# Patient Record
Sex: Male | Born: 1955 | Race: White | Hispanic: No | Smoking: Never smoker
Health system: Southern US, Community
[De-identification: ages and names within clinical notes are randomized; demographics above are authoritative.]

## PROBLEM LIST (undated history)

## (undated) DIAGNOSIS — I8390 Asymptomatic varicose veins of unspecified lower extremity: Secondary | ICD-10-CM

## (undated) DIAGNOSIS — K219 Gastro-esophageal reflux disease without esophagitis: Secondary | ICD-10-CM

## (undated) DIAGNOSIS — G629 Polyneuropathy, unspecified: Secondary | ICD-10-CM

## (undated) DIAGNOSIS — T7840XA Allergy, unspecified, initial encounter: Secondary | ICD-10-CM

## (undated) DIAGNOSIS — R0602 Shortness of breath: Secondary | ICD-10-CM

## (undated) HISTORY — DX: Polyneuropathy, unspecified: G62.9

## (undated) HISTORY — PX: VARICOSE VEIN SURGERY: SHX832

## (undated) HISTORY — PX: CATARACT EXTRACTION, BILATERAL: SHX1313

## (undated) HISTORY — PX: TONSILLECTOMY: SUR1361

## (undated) HISTORY — DX: Asymptomatic varicose veins of unspecified lower extremity: I83.90

## (undated) HISTORY — PX: HERNIA REPAIR: SHX51

## (undated) HISTORY — DX: Allergy, unspecified, initial encounter: T78.40XA

## (undated) HISTORY — DX: Shortness of breath: R06.02

---

## 2010-09-01 ENCOUNTER — Emergency Department (HOSPITAL_COMMUNITY)
Admission: EM | Admit: 2010-09-01 | Discharge: 2010-09-01 | Payer: Self-pay | Source: Home / Self Care | Admitting: Family Medicine

## 2011-01-21 ENCOUNTER — Ambulatory Visit (INDEPENDENT_AMBULATORY_CARE_PROVIDER_SITE_OTHER): Payer: Self-pay

## 2011-01-21 ENCOUNTER — Observation Stay (HOSPITAL_COMMUNITY)
Admission: EM | Admit: 2011-01-21 | Discharge: 2011-01-22 | Disposition: A | Discharge status: Home / Self Care | Payer: Self-pay | Attending: Emergency Medicine | Admitting: Emergency Medicine

## 2011-01-21 ENCOUNTER — Emergency Department (HOSPITAL_COMMUNITY): Payer: Self-pay

## 2011-01-21 ENCOUNTER — Inpatient Hospital Stay (INDEPENDENT_AMBULATORY_CARE_PROVIDER_SITE_OTHER)
Admission: RE | Admit: 2011-01-21 | Discharge: 2011-01-21 | Disposition: A | Discharge status: Home / Self Care | Payer: Self-pay | Source: Ambulatory Visit | Attending: Emergency Medicine | Admitting: Emergency Medicine

## 2011-01-21 DIAGNOSIS — R002 Palpitations: Secondary | ICD-10-CM

## 2011-01-21 DIAGNOSIS — R079 Chest pain, unspecified: Principal | ICD-10-CM | POA: Insufficient documentation

## 2011-01-21 DIAGNOSIS — R0602 Shortness of breath: Secondary | ICD-10-CM | POA: Insufficient documentation

## 2011-01-21 DIAGNOSIS — F1421 Cocaine dependence, in remission: Secondary | ICD-10-CM | POA: Insufficient documentation

## 2011-01-21 LAB — BASIC METABOLIC PANEL
BUN: 18 mg/dL (ref 6–23)
Chloride: 100 mEq/L (ref 96–112)
GFR calc Af Amer: >60 mL/min (ref >60)
Potassium: 3.7 mEq/L (ref 3.5–5.1)

## 2011-01-21 LAB — DIFFERENTIAL
Basophils Absolute: 0 10*3/uL (ref 0.0–0.1)
Eosinophils Relative: 2 % (ref 0–5)
Lymphocytes Relative: 19 % (ref 12–46)
Neutro Abs: 5.8 10*3/uL (ref 1.7–7.7)
Neutrophils Relative %: 72 % (ref 43–77)

## 2011-01-21 LAB — POCT URINALYSIS DIP (DEVICE)
Glucose, UA: NEGATIVE mg/dL
Leukocytes, UA: NEGATIVE
Nitrite: NEGATIVE
Urobilinogen, UA: 0.2 mg/dL (ref 0.0–1.0)

## 2011-01-21 LAB — CBC
HCT: 41.9 % (ref 39.0–52.0)
RDW: 13 % (ref 11.5–15.5)
WBC: 8.1 10*3/uL (ref 4.0–10.5)

## 2011-01-21 LAB — TROPONIN I: Troponin I: <0.3 ng/mL (ref <0.30)

## 2011-01-21 LAB — POCT I-STAT, CHEM 8
Glucose, Bld: 103 mg/dL — ABNORMAL HIGH (ref 70–99)
HCT: 47 % (ref 39.0–52.0)
Hemoglobin: 16 g/dL (ref 13.0–17.0)
Potassium: 3.8 mEq/L (ref 3.5–5.1)
Sodium: 141 mEq/L (ref 135–145)

## 2011-01-21 LAB — CK TOTAL AND CKMB (NOT AT ARMC)
Relative Index: 2.3 (ref 0.0–2.5)
Total CK: 205 U/L (ref 7–232)

## 2011-01-22 ENCOUNTER — Observation Stay (HOSPITAL_COMMUNITY): Payer: Self-pay

## 2011-01-22 ENCOUNTER — Encounter (HOSPITAL_COMMUNITY): Payer: Self-pay

## 2011-01-22 LAB — CK TOTAL AND CKMB (NOT AT ARMC)
CK, MB: 4.9 ng/mL — ABNORMAL HIGH (ref 0.3–4.0)
Relative Index: 2.6 — ABNORMAL HIGH (ref 0.0–2.5)
Total CK: 190 U/L (ref 7–232)
Total CK: 194 U/L (ref 7–232)

## 2011-01-22 LAB — TROPONIN I: Troponin I: <0.3 ng/mL (ref <0.30)

## 2011-01-22 MED ORDER — IOHEXOL 350 MG/ML SOLN
80.0000 mL | Freq: Once | INTRAVENOUS | Status: AC | PRN
  Administered 2011-01-22: 80 mL via INTRAVENOUS

## 2011-02-16 ENCOUNTER — Encounter: Payer: Self-pay | Admitting: *Deleted

## 2011-02-16 ENCOUNTER — Encounter: Payer: Self-pay | Admitting: Cardiovascular Disease

## 2011-02-17 ENCOUNTER — Encounter: Payer: Self-pay | Admitting: Cardiovascular Disease

## 2011-02-17 ENCOUNTER — Ambulatory Visit (INDEPENDENT_AMBULATORY_CARE_PROVIDER_SITE_OTHER): Payer: Self-pay | Admitting: Cardiovascular Disease

## 2011-02-17 DIAGNOSIS — R079 Chest pain, unspecified: Secondary | ICD-10-CM

## 2011-02-17 NOTE — Patient Instructions (Signed)
Your physician has requested that you have an exercise tolerance test. For further information please visit [email or web address]. Please also follow instruction sheet, as given.  Your physician has requested that you have an echocardiogram. Echocardiography is a painless test that uses sound waves to create images of your heart. It provides your doctor with information about the size and shape of your heart and how well your heart's chambers and valves are working. This procedure takes approximately one hour. There are no restrictions for this procedure.   

## 2011-02-17 NOTE — Assessment & Plan Note (Signed)
Exertional chest pressure, palpitations and SOB. His only risk factor for CAD is prior cocaine abuse. Will arrange treadmill stress test and echo.

## 2011-02-17 NOTE — Progress Notes (Signed)
History of Present Illness: 55 yo WM with history of venous insufficiency who is here today for new patient evaluation of palpitations. Former crack cocaine abuse but has not used in last 7 months. He was seen in the [facility] ED January 22, 2011 for chest pressure, palpitations. He describes tachycardia, diaphoresis, chest pressure when exerting himself. In the ED he had sinus rhythm. He is working in the [employer] in rehab and is feeling poorly. He has never smoked.   Past Medical History  Diagnosis Date  . SOB (shortness of breath)   . Chest pain     Past Surgical History  Procedure Date  . Hernia repair     No current outpatient prescriptions on file.    No Known Allergies  History   Social History  . Marital Status: Divorced    Spouse Name: N/A    Number of Children: N/A  . Years of Education: N/A   Occupational History  . Not on file.   Social History Main Topics  . Smoking status: Former Games developer  . Smokeless tobacco: Not on file  . Alcohol Use: Not on file  . Drug Use: Not on file  . Sexually Active: Not on file   Other Topics Concern  . Not on file   Social History Narrative  . No narrative on file    No family history on file.  Review of Systems:  As stated in the HPI and otherwise negative.   BP 122/85  Pulse 70  Resp 18  Ht 6\' 2"  (1.88 m)  Wt 240 lb (108.863 kg)  BMI 30.81 kg/m2  Physical Examination: General: Well developed, well nourished, NAD HEENT: OP clear, mucus membranes moist SKIN: warm, dry. No rashes. Neuro: No focal deficits Musculoskeletal: Muscle strength 5/5 all ext Psychiatric: Mood and affect normal Neck: No JVD, no carotid bruits, no thyromegaly, no lymphadenopathy. Lungs:Clear bilaterally, no wheezes, rhonci, crackles Cardiovascular: Regular rate and rhythm. No murmurs, gallops or rubs. Abdomen:Soft. Bowel sounds present. Non-tender.  Extremities: No lower extremity edema. Pulses are 2 + in the bilateral DP/PT.  EKG:NSR,  rate 67 bpm. Normal EKG.

## 2011-04-01 ENCOUNTER — Ambulatory Visit (HOSPITAL_COMMUNITY): Payer: Self-pay | Attending: Cardiovascular Disease | Admitting: Radiology

## 2011-04-01 ENCOUNTER — Ambulatory Visit (INDEPENDENT_AMBULATORY_CARE_PROVIDER_SITE_OTHER): Payer: Self-pay | Admitting: Cardiovascular Disease

## 2011-04-01 DIAGNOSIS — R002 Palpitations: Secondary | ICD-10-CM | POA: Insufficient documentation

## 2011-04-01 DIAGNOSIS — R072 Precordial pain: Secondary | ICD-10-CM | POA: Insufficient documentation

## 2011-04-01 DIAGNOSIS — R079 Chest pain, unspecified: Secondary | ICD-10-CM

## 2011-04-01 DIAGNOSIS — R0609 Other forms of dyspnea: Secondary | ICD-10-CM | POA: Insufficient documentation

## 2011-04-01 DIAGNOSIS — E669 Obesity, unspecified: Secondary | ICD-10-CM | POA: Insufficient documentation

## 2011-04-01 DIAGNOSIS — R0989 Other specified symptoms and signs involving the circulatory and respiratory systems: Secondary | ICD-10-CM | POA: Insufficient documentation

## 2011-04-01 NOTE — Progress Notes (Signed)
Exercise Treadmill Test  Pre-Exercise Testing Evaluation Rhythm: normal sinus  Rate: 67   PR:  .19 QRS:  .09  QT:  41 QTc: .43     Test  Exercise Tolerance Test Ordering MD: Melene Muller, MD  Interpreting MD:  Melene Muller, MD  Unique Test No: 1  Treadmill:  1  Indication for ETT: chest pain - rule out ischemia  Contraindication to ETT: No   Stress Modality: exercise - treadmill  Cardiac Imaging Performed: non   Protocol: standard Toure - maximal  Max BP:  170/88  Max MPHR (bpm):  166 85% MPR (bpm):  141  MPHR obtained (bpm): 171 % MPHR obtained:  103  Reached 85% MPHR (min:sec):  6:00 Total Exercise Time (min-sec):  9.0  Workload in METS:  16 Borg Scale: 10.1  Reason ETT Terminated:  fatigue    ST Segment Analysis At Rest: normal ST segments - no evidence of significant ST depression With Exercise: no evidence of significant ST depression  Other Information Arrhythmia:  No Angina during ETT:  absent (0) Quality of ETT:  non-diagnostic  ETT Interpretation:  normal - no evidence of ischemia by ST analysis  Comments: Excellent exercise tolerance. No chest pain with exercise. No EKG changes to suggest ischemia.   Recommendations: No further cardiac workup at this time.

## 2011-07-08 ENCOUNTER — Emergency Department (INDEPENDENT_AMBULATORY_CARE_PROVIDER_SITE_OTHER)
Admission: EM | Admit: 2011-07-08 | Discharge: 2011-07-08 | Disposition: A | Discharge status: Home / Self Care | Payer: Self-pay | Source: Home / Self Care | Attending: Family Medicine | Admitting: Family Medicine

## 2011-07-08 ENCOUNTER — Encounter (HOSPITAL_COMMUNITY): Payer: Self-pay | Admitting: Emergency Medicine

## 2011-07-08 DIAGNOSIS — M549 Dorsalgia, unspecified: Secondary | ICD-10-CM

## 2011-07-08 MED ORDER — NAPROXEN 500 MG PO TABS: 500.0000 mg | ORAL_TABLET | Freq: Two times a day (BID) | ORAL | Status: AC

## 2011-07-08 MED ORDER — CYCLOBENZAPRINE HCL 5 MG PO TABS: 5.0000 mg | ORAL_TABLET | Freq: Three times a day (TID) | ORAL | Status: AC | PRN
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
## 2012-01-31 ENCOUNTER — Encounter (HOSPITAL_COMMUNITY): Payer: Self-pay | Admitting: *Deleted

## 2012-01-31 ENCOUNTER — Emergency Department (INDEPENDENT_AMBULATORY_CARE_PROVIDER_SITE_OTHER)
Admission: EM | Admit: 2012-01-31 | Discharge: 2012-01-31 | Disposition: A | Discharge status: Home / Self Care | Payer: Self-pay | Source: Home / Self Care

## 2012-01-31 DIAGNOSIS — M25561 Pain in right knee: Secondary | ICD-10-CM

## 2012-01-31 DIAGNOSIS — M25569 Pain in unspecified knee: Secondary | ICD-10-CM

## 2012-01-31 MED ORDER — NAPROXEN 500 MG PO TABS: 500.0000 mg | ORAL_TABLET | Freq: Two times a day (BID) | ORAL | Status: DC

## 2012-01-31 MED ORDER — FAMOTIDINE 20 MG PO TABS: 20.0000 mg | ORAL_TABLET | Freq: Two times a day (BID) | ORAL | Status: DC

## 2012-01-31 NOTE — ED Provider Notes (Signed)
History     CSN: [id number]  Arrival date & time 01/31/12  1504   None     Chief Complaint  Patient presents with  . Knee Pain    (Consider location/radiation/quality/duration/timing/severity/associated sxs/prior treatment) Patient is a 56 y.o. male presenting with knee pain.  Knee Pain  pt reports right knee pain for three months that is getting progressively worse, states no known injury but has been doing increased daily walking, reports associated stiffness in the throughout the day, previous injury while playing sports in high school.  Pain is constant, able to ambulate but with noted limp.  Has not taken medication for pain, recently finished drug rehab program at [facility]  Past Medical History  Diagnosis Date  . SOB (shortness of breath)   . Chest pain     Past Surgical History  Procedure Date  . Hernia repair     History reviewed. No pertinent family history.  History  Substance Use Topics  . Smoking status: Former Games developer  . Smokeless tobacco: Not on file  . Alcohol Use: No      Review of Systems  All other systems reviewed and are negative.    Allergies  Review of patient's allergies indicates no known allergies.  Home Medications   Current Outpatient Rx  Name Route Sig Dispense Refill  . FAMOTIDINE 20 MG PO TABS Oral Take 1 tablet (20 mg total) by mouth 2 (two) times daily. 30 tablet 2  . NAPROXEN 500 MG PO TABS Oral Take 1 tablet (500 mg total) by mouth 2 (two) times daily. 30 tablet 0  . NAPROXEN 500 MG PO TABS Oral Take 1 tablet (500 mg total) by mouth 2 (two) times daily. 30 tablet 2    BP 122/87  Pulse 78  Temp 97.9 F (36.6 C) (Oral)  Resp 16  SpO2 95%  Physical Exam  Nursing note and vitals reviewed. Constitutional: He is oriented to person, place, and time. Vital signs are normal. He appears well-developed and well-nourished. He is active and cooperative.  HENT:  Head: Normocephalic.  Eyes: Conjunctivae are normal. Pupils  are equal, round, and reactive to light. No scleral icterus.  Neck: Trachea normal. Neck supple.  Cardiovascular: Normal rate, regular rhythm and normal heart sounds.   Pulmonary/Chest: Effort normal and breath sounds normal.  Musculoskeletal:       Right knee: He exhibits no swelling, no effusion, no erythema, normal alignment and no LCL laxity. no tenderness found. No medial joint line, no lateral joint line, no MCL, no LCL and no patellar tendon tenderness noted.       Left knee: Normal.  Neurological: He is alert and oriented to person, place, and time. He has normal reflexes. No cranial nerve deficit or sensory deficit.  Skin: Skin is warm and dry.  Psychiatric: He has a normal mood and affect. His speech is normal and behavior is normal. Judgment and thought content normal. Cognition and memory are normal.    ED Course  Procedures (including critical care time)  Labs Reviewed - No data to display No results found.   1. Right knee pain       MDM  No imaging indicated, begin medication as ordered, famotidine for GERD.  Johnsie Kindred, NP 01/31/12 1710

## 2012-01-31 NOTE — Discharge Instructions (Signed)
Knee Pain The knee is the complex joint between your thigh and your lower leg. It is made up of bones, tendons, ligaments, and cartilage. The bones that make up the knee are:  The femur in the thigh.   The tibia and fibula in the lower leg.   The patella or kneecap riding in the groove on the lower femur.  CAUSES  Knee pain is a common complaint with many causes. A few of these causes are:  Injury, such as:   A ruptured ligament or tendon injury.   Torn cartilage.   Medical conditions, such as:   Gout   Arthritis   Infections   Overuse, over training or overdoing a physical activity.  Knee pain can be minor or severe. Knee pain can accompany debilitating injury. Minor knee problems often respond well to self-care measures or get well on their own. More serious injuries may need medical intervention or even surgery. SYMPTOMS The knee is complex. Symptoms of knee problems can vary widely. Some of the problems are:  Pain with movement and weight bearing.   Swelling and tenderness.   Buckling of the knee.   Inability to straighten or extend your knee.   Your knee locks and you cannot straighten it.   Warmth and redness with pain and fever.   Deformity or dislocation of the kneecap.  DIAGNOSIS  Determining what is wrong may be very straight forward such as when there is an injury. It can also be challenging because of the complexity of the knee. Tests to make a diagnosis may include:  Your caregiver taking a history and doing a physical exam.   Routine X-rays can be used to rule out other problems. X-rays will not reveal a cartilage tear. Some injuries of the knee can be diagnosed by:   Arthroscopy a surgical technique by which a small video camera is inserted through tiny incisions on the sides of the knee. This procedure is used to examine and repair internal knee joint problems. Tiny instruments can be used during arthroscopy to repair the torn knee cartilage  (meniscus).   Arthrography is a radiology technique. A contrast liquid is directly injected into the knee joint. Internal structures of the knee joint then become visible on X-ray film.   An MRI scan is a non x-ray radiology procedure in which magnetic fields and a computer produce two- or three-dimensional images of the inside of the knee. Cartilage tears are often visible using an MRI scanner. MRI scans have largely replaced arthrography in diagnosing cartilage tears of the knee.   Blood work.   Examination of the fluid that helps to lubricate the knee joint (synovial fluid). This is done by taking a sample out using a needle and a syringe.  TREATMENT The treatment of knee problems depends on the cause. Some of these treatments are:  Depending on the injury, proper casting, splinting, surgery or physical therapy care will be needed.   Give yourself adequate recovery time. Do not overuse your joints. If you begin to get sore during workout routines, back off. Slow down or do fewer repetitions.   For repetitive activities such as cycling or running, maintain your strength and nutrition.   Alternate muscle groups. For example if you are a weight lifter, work the upper body on one day and the lower body the next.   Either tight or weak muscles do not give the proper support for your knee. Tight or weak muscles do not absorb the stress placed   on the knee joint. Keep the muscles surrounding the knee strong.   Take care of mechanical problems.   If you have flat feet, orthotics or special shoes may help. See your caregiver if you need help.   Arch supports, sometimes with wedges on the inner or outer aspect of the heel, can help. These can shift pressure away from the side of the knee most bothered by osteoarthritis.   A brace called an "unloader" brace also may be used to help ease the pressure on the most arthritic side of the knee.   If your caregiver has prescribed crutches, braces,  wraps or ice, use as directed. The acronym for this is PRICE. This means protection, rest, ice, compression and elevation.   Nonsteroidal anti-inflammatory drugs (NSAID's), can help relieve pain. But if taken immediately after an injury, they may actually increase swelling. Take NSAID's with food in your stomach. Stop them if you develop stomach problems. Do not take these if you have a history of ulcers, stomach pain or bleeding from the bowel. Do not take without your caregiver's approval if you have problems with fluid retention, heart failure, or kidney problems.   For ongoing knee problems, physical therapy may be helpful.   Glucosamine and chondroitin are over-the-counter dietary supplements. Both may help relieve the pain of osteoarthritis in the knee. These medicines are different from the usual anti-inflammatory drugs. Glucosamine may decrease the rate of cartilage destruction.   Injections of a corticosteroid drug into your knee joint may help reduce the symptoms of an arthritis flare-up. They may provide pain relief that lasts a few months. You may have to wait a few months between injections. The injections do have a small increased risk of infection, water retention and elevated blood sugar levels.   Hyaluronic acid injected into damaged joints may ease pain and provide lubrication. These injections may work by reducing inflammation. A series of shots may give relief for as long as 6 months.   Topical painkillers. Applying certain ointments to your skin may help relieve the pain and stiffness of osteoarthritis. Ask your pharmacist for suggestions. Many over the-counter products are approved for temporary relief of arthritis pain.   In some countries, doctors often prescribe topical NSAID's for relief of chronic conditions such as arthritis and tendinitis. A review of treatment with NSAID creams found that they worked as well as oral medications but without the serious side effects.    PREVENTION  Maintain a healthy weight. Extra pounds put more strain on your joints.   Get strong, stay limber. Weak muscles are a common cause of knee injuries. Stretching is important. Include flexibility exercises in your workouts.   Be smart about exercise. If you have osteoarthritis, chronic knee pain or recurring injuries, you may need to change the way you exercise. This does not mean you have to stop being active. If your knees ache after jogging or playing basketball, consider switching to swimming, water aerobics or other low-impact activities, at least for a few days a week. Sometimes limiting high-impact activities will provide relief.   Make sure your shoes fit well. Choose footwear that is right for your sport.   Protect your knees. Use the proper gear for knee-sensitive activities. Use kneepads when playing volleyball or laying carpet. Buckle your seat belt every time you drive. Most shattered kneecaps occur in car accidents.   Rest when you are tired.  SEEK MEDICAL CARE IF:  You have knee pain that is continual and does not   seem to be getting better.  SEEK IMMEDIATE MEDICAL CARE IF:  Your knee joint feels hot to the touch and you have a high fever. MAKE SURE YOU:   Understand these instructions.   Will watch your condition.   Will get help right away if you are not doing well or get worse.  Document Released: 05/17/2007 Document Revised: 07/09/2011 Document Reviewed: 05/17/2007 [employer] Patient Information 2012 [employer]. 

## 2012-01-31 NOTE — ED Provider Notes (Signed)
Medical screening examination/treatment/procedure(s) were performed by non-physician practitioner and as supervising physician I was immediately available for consultation/collaboration.  Leslee Home, M.D.   Reuben Likes, MD 01/31/12 [phone]

## 2012-01-31 NOTE — ED Notes (Signed)
Pt with c/o pain right knee onset 3 months swelling - worse with movement -

## 2012-12-01 ENCOUNTER — Encounter (HOSPITAL_COMMUNITY): Payer: Self-pay

## 2012-12-01 ENCOUNTER — Emergency Department (INDEPENDENT_AMBULATORY_CARE_PROVIDER_SITE_OTHER)
Admission: EM | Admit: 2012-12-01 | Discharge: 2012-12-01 | Disposition: A | Discharge status: Home Health | Payer: No Typology Code available for payment source | Source: Home / Self Care

## 2012-12-01 DIAGNOSIS — R079 Chest pain, unspecified: Secondary | ICD-10-CM

## 2012-12-01 MED ORDER — OMEPRAZOLE 10 MG PO CPDR: 40.0000 mg | DELAYED_RELEASE_CAPSULE | Freq: Every day | ORAL | Status: DC

## 2012-12-01 NOTE — ED Notes (Signed)
Patient complains of having heartburn and acid reflux Get a sour taste most of time

## 2012-12-01 NOTE — ED Provider Notes (Signed)
Patient Demographics  Justin Mcbride, is a 57 y.o. male  WUJ:[id number]  NFA:[id number]  DOB - [birth year]  Chief Complaint  Patient presents with  . Heartburn        Subjective:   Justin Mcbride today is here to establish primary care.For the past few months he has been having "heartburn"-that has started to worsen lately. He takes tums that provides him relief temporarily. He denies any weight loss or difficulty swallowing. He denies any chest pain-either at rest or exertion. He last did cocaine approximately 1 year back, and he was a alcoholic as well-claims he has drank in "years". He claims that he has nocturia but no hesitation, and good stream.   Patient has No headache, No chest pain, No abdominal pain - No Nausea, No new weakness tingling or numbness, No Cough - SOB.  Objective:    Filed Vitals:   12/01/12 1552  BP: 132/97  Pulse: 72  Temp: 98.1 F (36.7 C)  TempSrc: Oral  Resp: 16  SpO2: 100%     ALLERGIES:  No Known Allergies  PAST MEDICAL HISTORY: Past Medical History  Diagnosis Date  . SOB (shortness of breath)   . Chest pain     PAST SURGICAL HISTORY: Past Surgical History  Procedure Laterality Date  . Hernia repair      FAMILY HISTORY: Father-"Bone Cancer"  MEDICATIONS AT HOME: Prior to Admission medications   Medication Sig Start Date End Date Taking? Authorizing Provider  omeprazole (PRILOSEC) 10 MG capsule Take 4 capsules (40 mg total) by mouth daily. 12/01/12   Shanker Levora Dredge, MD    REVIEW OF SYSTEMS:  Constitutional:   No   Fevers, chills, fatigue.  HEENT:    No headaches, Sore throat,   Cardio-vascular: No chest pain,  Orthopnea, swelling in lower extremities, anasarca, palpitations  GI:  No abdominal pain, nausea, vomiting, diarrhea  Resp: No shortness of breath,  No coughing up of blood.No cough.No wheezing.  Skin:  no rash or lesions.  GU:  no dysuria, change in color of urine.  No flank pain.  Musculoskeletal: No joint  pain or swelling.  No decreased range of motion.  No back pain.  Psych: No change in mood or affect. No depression or anxiety.  No memory loss.   Exam  General appearance :Awake, alert, not in any distress. Speech Clear. Not toxic Looking HEENT: Atraumatic and Normocephalic, pupils equally reactive to light and accomodation Neck: supple, no JVD. No cervical lymphadenopathy.  Chest:Good air entry bilaterally, no added sounds  CVS: S1 S2 regular, no murmurs.  Abdomen: Bowel sounds present, Non tender and not distended with no gaurding, rigidity or rebound. Extremities: B/L Lower Ext shows no edema, both legs are warm to touch Neurology: Awake alert, and oriented X 3, CN II-XII intact, Non focal Skin:No Rash Wounds:N/A    Data Review   CBC No results found for this basename: WBC, HGB, HCT, PLT, MCV, MCH, MCHC, RDW, NEUTRABS, LYMPHSABS, MONOABS, EOSABS, BASOSABS, BANDABS, BANDSABD,  in the last 168 hours  Chemistries   No results found for this basename: NA, K, CL, CO2, GLUCOSE, BUN, CREATININE, GFRCGP, CALCIUM, MG, AST, ALT, ALKPHOS, BILITOT,  in the last 168 hours ------------------------------------------------------------------------------------------------------------------ No results found for this basename: HGBA1C,  in the last 72 hours ------------------------------------------------------------------------------------------------------------------ No results found for this basename: CHOL, HDL, LDLCALC, TRIG, CHOLHDL, LDLDIRECT,  in the last 72 hours ------------------------------------------------------------------------------------------------------------------ No results found for this basename: TSH, T4TOTAL, FREET3, T3FREE, THYROIDAB,  in the last 72 hours ------------------------------------------------------------------------------------------------------------------  No results found for this basename: VITAMINB12, FOLATE, FERRITIN, TIBC, IRON, RETICCTPCT,  in the last 72  hours  Coagulation profile  No results found for this basename: INR, PROTIME,  in the last 168 hours    Assessment & Plan   Active Problems: Suspected GERD -trial of PPI and lifestyle/diet modifications-gave hand out -follow up in one month-if no improvement-needs GI referral  Suspected BPH -prefers to wait before starting Flomax-will check routine labs prior to next visit  Former ETOH/Cocaine user -check UDS periodically-claims he is currently clean  Follow up in 1 month-have ordered labs-please check at next visit.   Follow-up Information   Follow up with [facility]. Schedule an appointment as soon as possible for a visit in 1 month.      Maretta Bees, MD 12/01/12 [phone]

## 2012-12-13 ENCOUNTER — Telehealth: Payer: Self-pay | Admitting: General Practice

## 2012-12-13 NOTE — Telephone Encounter (Signed)
Pharm trying to confirm omeprazole script written on 12/01/12 by Dr. Jerral Ralph.

## 2012-12-19 ENCOUNTER — Telehealth: Payer: Self-pay | Admitting: *Deleted

## 2012-12-19 NOTE — Telephone Encounter (Signed)
Spoke with patient regarding Prilosec has already been filled At [facility].

## 2013-02-07 ENCOUNTER — Encounter: Payer: Self-pay | Admitting: Internal Medicine

## 2013-02-07 ENCOUNTER — Ambulatory Visit: Payer: No Typology Code available for payment source | Attending: Family Medicine | Admitting: Internal Medicine

## 2013-02-07 VITALS — BP 135/90 | HR 72 | Temp 98.7°F | Resp 18 | Ht 74.0 in | Wt 256.0 lb

## 2013-02-07 DIAGNOSIS — G589 Mononeuropathy, unspecified: Secondary | ICD-10-CM

## 2013-02-07 DIAGNOSIS — G629 Polyneuropathy, unspecified: Secondary | ICD-10-CM | POA: Insufficient documentation

## 2013-02-07 DIAGNOSIS — Z79899 Other long term (current) drug therapy: Secondary | ICD-10-CM | POA: Insufficient documentation

## 2013-02-07 DIAGNOSIS — G5790 Unspecified mononeuropathy of unspecified lower limb: Secondary | ICD-10-CM | POA: Insufficient documentation

## 2013-02-07 DIAGNOSIS — I8390 Asymptomatic varicose veins of unspecified lower extremity: Secondary | ICD-10-CM | POA: Insufficient documentation

## 2013-02-07 LAB — LIPID PANEL
Cholesterol: 215 mg/dL — ABNORMAL HIGH (ref 0–200)
HDL: 29 mg/dL — ABNORMAL LOW
LDL Cholesterol: 149 mg/dL — ABNORMAL HIGH (ref 0–99)
Total CHOL/HDL Ratio: 7.4 ratio
Triglycerides: 186 mg/dL — ABNORMAL HIGH
VLDL: 37 mg/dL (ref 0–40)

## 2013-02-07 LAB — POCT GLYCOSYLATED HEMOGLOBIN (HGB A1C): Hemoglobin A1C: 5.5

## 2013-02-07 MED ORDER — TRAMADOL HCL 50 MG PO TABS: 50.0000 mg | ORAL_TABLET | Freq: Three times a day (TID) | ORAL | Status: DC | PRN

## 2013-02-07 MED ORDER — PREGABALIN 50 MG PO CAPS: 50.0000 mg | ORAL_CAPSULE | Freq: Three times a day (TID) | ORAL | Status: DC

## 2013-02-07 NOTE — Progress Notes (Signed)
Patient ID: Justin Mcbride, male   DOB: 1955/12/01, 57 y.o.   MRN: [id number]  CC: Neuropathy  HPI: 57 year old male with no significant past medical history who presented to our clinic for evaluation of lower extremity neuropathy and varicose veins. Patient reports this to be a chronic problem. He reports pain is worse when standing on one spot and when he tries to move he feels tingling sensation and numbness in his feet. Patient reports that over the course of the day this numbness and tingling is getting worse. No complaints of chest pain or palpitations or shortness of breath. No loss of sensation. No weakness.  No Known Allergies Past Medical History  Diagnosis Date  . SOB (shortness of breath)   . Chest pain    Current Outpatient Prescriptions on File Prior to Visit  Medication Sig Dispense Refill  . omeprazole (PRILOSEC) 10 MG capsule Take 4 capsules (40 mg total) by mouth daily.  30 capsule  1  . [DISCONTINUED] famotidine (PEPCID) 20 MG tablet Take 1 tablet (20 mg total) by mouth 2 (two) times daily.  30 tablet  2   No current facility-administered medications on file prior to visit.   Family history significant for heart disease.  History   Social History  . Marital Status: Divorced    Spouse Name: N/A    Number of Children: N/A  . Years of Education: N/A   Occupational History  . Not on file.   Social History Main Topics  . Smoking status: Former Games developer  . Smokeless tobacco: Not on file  . Alcohol Use: No  . Drug Use: Yes    Special: Cocaine     Comment: none x 7 months   . 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 Not on file   Social History Narrative  . No narrative on file    Review of Systems  Constitutional: Negative for fever, chills, diaphoresis, activity change, appetite change and fatigue.  HENT: Negative for ear pain, nosebleeds, congestion, facial swelling, rhinorrhea, neck pain,  neck stiffness and ear discharge.   Eyes: Negative for pain, discharge, redness, itching and visual disturbance.  Respiratory: Negative for cough, choking, chest tightness, shortness of breath, wheezing and stridor.   Cardiovascular: Negative for chest pain, palpitations and leg swelling.  Gastrointestinal: Negative for abdominal distention.  Genitourinary: Negative for dysuria, urgency, frequency, hematuria, flank pain, decreased urine volume, difficulty urinating and dyspareunia.  Musculoskeletal: Negative for back pain, joint swelling, arthralgias and gait problem.  Neurological: Negative for dizziness, tremors, seizures, syncope, facial asymmetry, speech difficulty, weakness, light-headedness. Positive for numbness and tingling in feet  Hematological: Negative for adenopathy. Does not bruise/bleed easily.  Psychiatric/Behavioral: Negative for hallucinations, behavioral problems, confusion, dysphoric mood, decreased concentration and agitation.    Objective:   Filed Vitals:   02/07/13 1619  BP: 135/90  Pulse: 72  Temp: 98.7 F (37.1 C)  Resp: 18    Physical Exam  Constitutional: Appears well-developed and well-nourished. No distress.  HENT: Normocephalic. External right and left ear normal. Oropharynx is clear and moist.  Eyes: Conjunctivae and EOM are normal. PERRLA, no scleral icterus.  Neck: Normal ROM. Neck supple. No JVD. No tracheal deviation. No thyromegaly.  CVS: RRR, S1/S2 +, no murmurs, no gallops, no carotid bruit.  Pulmonary: Effort and breath sounds normal, no stridor, rhonchi, wheezes, rales.  Abdominal: Soft. BS +,  no distension, tenderness, rebound or  guarding.  Musculoskeletal: Normal range of motion. No edema and no tenderness.  Lymphadenopathy: No lymphadenopathy noted, cervical, inguinal. Neuro: Alert. Normal reflexes, muscle tone coordination. No cranial nerve deficit. Skin: Skin is warm and dry. Varicose veins bilaterally  Psychiatric: Normal mood and  affect. Behavior, judgment, thought content normal.   Lab Results  Component Value Date   WBC 8.1 01/21/2011   HGB 14.1 01/21/2011   HCT 41.9 01/21/2011   MCV 86.4 01/21/2011   PLT 192 01/21/2011   Lab Results  Component Value Date   CREATININE 0.78 01/21/2011   BUN 18 01/21/2011   NA 138 01/21/2011   K 3.7 01/21/2011   CL 100 01/21/2011   CO2 27 01/21/2011    No results found for this basename: HGBA1C   Lipid Panel  No results found for this basename: chol, trig, hdl, cholhdl, vldl, ldlcalc       Assessment and plan:   Patient Active Problem List   Diagnosis Date Noted  . Neuropathy 02/07/2013    Priority: High - Prescription provided for Lyrica 50 mg 3 times a day as well as Ultram as needed for breakthrough pain relief - Referral to vascular surgery provide for further evaluation of arterial blood flow  - check A1c and lipid panel

## 2013-02-07 NOTE — Progress Notes (Signed)
Pt here for office visit concerning pain in both legs with numbness in l ft toes since last visit. Pain is increasing. Taking tylenol for comfort

## 2013-02-07 NOTE — Patient Instructions (Addendum)
Neuropathy Neuropathy means your peripheral nerves are not working normally. Peripheral nerves are the nerves outside the brain and spinal cord. Messages between the brain and the rest of the body do not work properly with peripheral nerve disorders. CAUSES There are many different causes of peripheral nerve disorders. These include:  Injury.   Infections.   Diabetes.   Vitamin deficiency.   Poor circulation.   Alcoholism.   Exposure to toxins.   Drug effects.   Tumors.   Kidney disease.  SYMPTOMS  Tingling, burning, pain, and numbness in the extremities.   Weakness and loss of muscle tone and size.  DIAGNOSIS Blood tests and special studies of nerve function may help confirm the diagnosis.  TREATMENT  Treatment includes adopting healthy life habits.   A good diet, vitamin supplements, and mild pain medicine may be needed.   Avoid known toxins such as alcohol, tobacco, and recreational drugs.   Anti-convulsant medicines are helpful in some types of neuropathy.  Make a follow-up appointment with your caregiver to be sure you are getting better with treatment.  SEEK IMMEDIATE MEDICAL CARE IF:   You have breathing problems.   You have severe or uncontrolled pain.   You notice extreme weakness or you feel faint.   You are not better after 1 week or if you have worse symptoms.  Document Released: 08/27/2004 Document Revised: 04/01/2011 Document Reviewed: 07/20/2005 [employer] Patient Information 2012 [employer]. 

## 2013-02-10 ENCOUNTER — Encounter: Payer: Self-pay | Admitting: Vascular Surgery

## 2013-02-10 ENCOUNTER — Other Ambulatory Visit: Payer: Self-pay

## 2013-02-10 DIAGNOSIS — I83893 Varicose veins of bilateral lower extremities with other complications: Secondary | ICD-10-CM

## 2013-02-10 DIAGNOSIS — M79609 Pain in unspecified limb: Secondary | ICD-10-CM

## 2013-03-24 ENCOUNTER — Encounter: Payer: No Typology Code available for payment source | Admitting: Vascular Surgery

## 2013-04-13 ENCOUNTER — Encounter: Payer: Self-pay | Admitting: Vascular Surgery

## 2013-04-14 ENCOUNTER — Encounter: Payer: Self-pay | Admitting: Vascular Surgery

## 2013-04-14 ENCOUNTER — Ambulatory Visit (INDEPENDENT_AMBULATORY_CARE_PROVIDER_SITE_OTHER): Payer: No Typology Code available for payment source | Admitting: Vascular Surgery

## 2013-04-14 ENCOUNTER — Encounter (INDEPENDENT_AMBULATORY_CARE_PROVIDER_SITE_OTHER): Payer: No Typology Code available for payment source | Admitting: *Deleted

## 2013-04-14 VITALS — BP 138/93 | HR 66 | Resp 18 | Ht 73.0 in | Wt 257.0 lb

## 2013-04-14 DIAGNOSIS — M79609 Pain in unspecified limb: Secondary | ICD-10-CM

## 2013-04-14 DIAGNOSIS — I872 Venous insufficiency (chronic) (peripheral): Secondary | ICD-10-CM | POA: Insufficient documentation

## 2013-04-14 DIAGNOSIS — I83893 Varicose veins of bilateral lower extremities with other complications: Secondary | ICD-10-CM

## 2013-04-14 NOTE — Progress Notes (Signed)
[facility]  Referred by:  Alison Murray, MD [address] [place], [place] [address]  Reason for referral: Swollen bilateral leg  History of Present Illness  Justin Mcbride is a 57 y.o. (02-17-1956) male who presents with chief complaint: B swollen leg.  Patient notes, onset of swelling decades ago, associated with no obvious trigger.  The patient's symptoms include: aching, swelling, and bursting sensation.  The patient has had no history of DVT, known history of varicose vein, prior history of left ankle venous stasis ulcers, no history of  Lymphedema and known history of skin changes in lower legs.  There is no family history of venous disorders.  The patient has used thigh compression stockings in the past but not recently.  The pt come in due to recent healing of his L ankle VSU.  Pt notes some toe numbness also  Past Medical History  Diagnosis Date  . SOB (shortness of breath)   . Chest pain   . Varicose veins   . Neuropathy   . Allergy     Past Surgical History  Procedure Laterality Date  . Hernia repair    . Hernia repair    . Tonsillectomy      History   Social History  . Marital Status: Divorced    Spouse Name: N/A    Number of Children: N/A  . Years of Education: N/A   Occupational History  . Not on file.   Social History Main Topics  . Smoking status: Never Smoker   . Smokeless tobacco: Never Used  . Alcohol Use: No  . Drug Use: Yes    Special: Cocaine     Comment: none x 7 months   . 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 Not on file   Social History Narrative  . No narrative on file    Family History  Problem Relation Age of Onset  . Cancer Father     lung     Current Outpatient Prescriptions on File Prior to Visit  Medication Sig Dispense Refill  . omeprazole (PRILOSEC) 10 MG capsule Take 4 capsules (40 mg total) by mouth daily.  30  capsule  1  . pregabalin (LYRICA) 50 MG capsule Take 1 capsule (50 mg total) by mouth 3 (three) times daily.  90 capsule  0  . traMADol (ULTRAM) 50 MG tablet Take 1 tablet (50 mg total) by mouth every 8 (eight) hours as needed for pain.  45 tablet  0  . [DISCONTINUED] famotidine (PEPCID) 20 MG tablet Take 1 tablet (20 mg total) by mouth 2 (two) times daily.  30 tablet  2   No current facility-administered medications on file prior to visit.    No Known Allergies  REVIEW OF SYSTEMS:  (Positives checked otherwise negative)  CARDIOVASCULAR:  []  chest pain, []  chest pressure, []  palpitations, []  shortness of breath when laying flat, []  shortness of breath with exertion,  [x]  pain in feet when walking, [x]  pain in feet when laying flat, []  history of blood clot in veins (DVT), []  history of phlebitis, [x]  swelling in legs, [x]  varicose veins  PULMONARY:  []  productive cough, []  asthma, []  wheezing  NEUROLOGIC:  []  weakness in arms or legs, []  numbness in arms or legs, []  difficulty speaking or slurred speech, []  temporary loss of vision in one eye, []  dizziness  HEMATOLOGIC:  []  bleeding problems, []  problems with blood clotting too easily  MUSCULOSKEL:  []  joint pain, []  joint swelling  GASTROINTEST:  []  vomiting blood, []  blood in stool     GENITOURINARY:  []  burning with urination, []  blood in urine  PSYCHIATRIC:  []  history of major depression  INTEGUMENTARY:  []  rashes, []  ulcers  CONSTITUTIONAL:  []  fever, []  chills  Physical Examination Filed Vitals:   04/14/13 1307  BP: 138/93  Pulse: 66  Resp: 18  Height: 6\' 1"  (1.854 m)  Weight: 257 lb (116.574 kg)   Body mass index is 33.91 kg/(m^2).  General: A&O x 3, WD, mildly obese  Head: [place]/AT  Ear/Nose/Throat: Hearing grossly intact, nares w/o erythema or drainage, oropharynx w/o Erythema/Exudate  Eyes: PERRLA, EOMI  Neck: Supple, no nuchal rigidity, no palpable LAD  Pulmonary: Sym exp, good air movt, CTAB, no rales,  rhonchi, & wheezing  Cardiac: RRR, Nl S1, S2, no Murmurs, rubs or gallops  Vascular: Vessel Right Left  Radial Palpable Palpable  Brachial Palpable Palpable  Carotid Palpable, without bruit Palpable, without bruit  Aorta Not palpable N/A  Femoral Palpable Palpable  Popliteal Not palpable Not palpable  PT Palpable Palpable  DP Palpable Palpable   Gastrointestinal: soft, NTND, -G/R, - HSM, - masses, - CVAT B  Musculoskeletal: M/S 5/5 throughout , Extremities without ischemic changes , B LDS, extensive large varicosities bilaterally, L ankle healed ulcer site  Neurologic: CN 2-12 intact , Pain and light touch intact in extremities , Motor exam as listed above  Psychiatric: Judgment intact, Mood & affect appropriate for pt's clinical situation  Dermatologic: See M/S exam for extremity exam, no rashes otherwise noted  Lymph : No Cervical, Axillary, or Inguinal lymphadenopathy   Non-Invasive Vascular Imaging   BLE Venous Insufficiency Duplex (Date: 04/14/2013):   RLE: no DVT and SVT, + GSV reflux, + deep venous reflux, + SSV reflux  LLE: no DVT and SVT, + GSV reflux, + deep venous reflux  ABI (Date: 04/14/2013)  R: 1.29 (), DP: tri, PT: tri, TBI: 0.85  L: 1.32 (), DP: tri, PT: tri, TBI: 0.88   Outside Studies/Documentation 3 pages of outside documents were reviewed including: outpatient clinic chart.  Medical Decision Making  Justin Mcbride is a 57 y.o. male who presents with: BLE chronic venous insufficiency (C5), medial calcification of BLE arteries, no evidence of PAD at this point   Based on the patient's history and examination, I recommend: compressive therapy for 3 months followed by EVLA +/- stab phlebectomy BLE.  I discussed with the patient the use of her 20-30 mm thigh high compression stockings and need for 3 month trial of such.  The patient will follow up in 3 months with my partners in the [facility] for evaluation for: EVLA +/- stab phlebectomy.  Thank  you for allowing [place] to participate in this patient's care.  Leonides Sake, MD [facility] Office: [phone] Pager: [phone]  04/14/2013, 1:59 PM

## 2013-05-23 ENCOUNTER — Ambulatory Visit (HOSPITAL_COMMUNITY)
Admission: RE | Admit: 2013-05-23 | Discharge: 2013-05-23 | Disposition: A | Discharge status: Home / Self Care | Payer: No Typology Code available for payment source | Source: Ambulatory Visit | Attending: Internal Medicine | Admitting: Internal Medicine

## 2013-05-23 ENCOUNTER — Ambulatory Visit: Payer: No Typology Code available for payment source | Attending: Internal Medicine | Admitting: Internal Medicine

## 2013-05-23 VITALS — BP 130/88 | HR 78 | Temp 98.1°F | Resp 16 | Ht 73.0 in | Wt 252.0 lb

## 2013-05-23 DIAGNOSIS — J209 Acute bronchitis, unspecified: Secondary | ICD-10-CM | POA: Insufficient documentation

## 2013-05-23 DIAGNOSIS — R059 Cough, unspecified: Secondary | ICD-10-CM | POA: Insufficient documentation

## 2013-05-23 DIAGNOSIS — I872 Venous insufficiency (chronic) (peripheral): Secondary | ICD-10-CM | POA: Insufficient documentation

## 2013-05-23 DIAGNOSIS — R05 Cough: Secondary | ICD-10-CM | POA: Insufficient documentation

## 2013-05-23 DIAGNOSIS — K449 Diaphragmatic hernia without obstruction or gangrene: Secondary | ICD-10-CM | POA: Insufficient documentation

## 2013-05-23 MED ORDER — CETIRIZINE HCL 10 MG PO TABS: 10.0000 mg | ORAL_TABLET | Freq: Every day | ORAL | Status: DC

## 2013-05-23 MED ORDER — AZITHROMYCIN 250 MG PO TABS: ORAL_TABLET | ORAL | Status: DC

## 2013-05-23 NOTE — Progress Notes (Signed)
Pt here for URI/SINUS sx's x 4 dys Cough with noted blood tinged sputum today,sob Joint pain all over Not taking medication

## 2013-05-23 NOTE — Progress Notes (Signed)
Patient ID: Justin Mcbride, male   DOB: 1956/03/16, 57 y.o.   MRN: [id number]  History of present illness 57 year old male with chronic venous insufficiency here with symptoms of cough, nasal congestion and an episode of hemoptysis this morning. She reports having cough and nasal congestion for almost one week. Denies any headache, fever or chills. He reports having several bouts of cough this morning followed by about 1 teaspoon full bright red blood. He reports having cough with whitish phlegm. Denies any sinus tenderness or ear aches. Denies chest pain, palpitations,. Does report feeling winded. Denies orthopnea or PND. Chronic venous insufficiency and also feels tingling in his legs at bedtime. He was prescribed Lyrica but he did not take his prescriptions. He was recently seen by a vascular surgeon and was recommended for compressive therapy for 3 months and followup in the clinic.  Vital signs in last 24 hours:  Filed Vitals:   05/23/13 1133  BP: 130/88  Pulse: 78  Temp: 98.1 F (36.7 C)  TempSrc: Oral  Resp: 16  Height: 6\' 1"  (1.854 m)  Weight: 252 lb (114.306 kg)  SpO2: 98%      Physical Exam:  General: middle Aged  male in no acute distress. HEENT: no pallor, no icterus, moist oral mucosa, no JVD, no lymphadenopathy Heart: Normal  s1 &s2  Regular rate and rhythm, without murmurs, rubs, gallops. Lungs: Clear to auscultation bilaterally. Extremities: Chronic venous insufficiency bilaterally Neuro: Alert, awake, oriented x3, nonfocal.   Lab Results:  Basic Metabolic Panel:    Component Value Date/Time   NA 138 01/21/2011 2131   K 3.7 01/21/2011 2131   CL 100 01/21/2011 2131   CO2 27 01/21/2011 2131   BUN 18 01/21/2011 2131   CREATININE 0.78 01/21/2011 2131   GLUCOSE 95 01/21/2011 2131   CALCIUM 8.9 01/21/2011 2131   CBC:    Component Value Date/Time   WBC 8.1 01/21/2011 2131   HGB 14.1 01/21/2011 2131   HCT 41.9 01/21/2011 2131   PLT 192 01/21/2011 2131   MCV 86.4  01/21/2011 2131   NEUTROABS 5.8 01/21/2011 2131   LYMPHSABS 1.5 01/21/2011 2131   MONOABS 0.6 01/21/2011 2131   EOSABS 0.1 01/21/2011 2131   BASOSABS 0.0 01/21/2011 2131    No results found for this or any previous visit (from the past 240 hour(s)).  Studies/Results: No results found.  Medications: Scheduled Meds: Continuous Infusions: PRN Meds:.    Assessment/Plan:  Acute bronchitis I will order  a chest x-ray to rule out any infiltrates. I will prescribe him with a course of Z-Pak and Zyrtec at bedtime. Instructed on drinking plenty of water and steam inhalation to help improve his nasal congestion. Patient had one episode of hemoptysis this morning likely in the setting of persistent coughing and online bronchitis. I will followup with a chest x-ray. Patient  Return back to the clinic or go to the ED if he has worsening of his symptoms with associated fever or further  Hemoptysis.  Chronic venous insufficiency Follows with vascular surgery. Patient was prescribed Lyrica for RLS -like symptoms and has not filled them.  Followup as scheduled previously  Khing Belcher 05/23/2013, 12:01 PM

## 2013-06-08 ENCOUNTER — Other Ambulatory Visit: Payer: Self-pay

## 2013-07-14 ENCOUNTER — Encounter: Payer: Self-pay | Admitting: Vascular Surgery

## 2013-07-17 ENCOUNTER — Encounter: Payer: Self-pay | Admitting: Vascular Surgery

## 2013-07-17 ENCOUNTER — Ambulatory Visit (INDEPENDENT_AMBULATORY_CARE_PROVIDER_SITE_OTHER): Payer: No Typology Code available for payment source | Admitting: Vascular Surgery

## 2013-07-17 VITALS — BP 142/93 | HR 71 | Resp 18 | Ht 73.0 in | Wt 253.0 lb

## 2013-07-17 DIAGNOSIS — I83893 Varicose veins of bilateral lower extremities with other complications: Secondary | ICD-10-CM

## 2013-07-17 NOTE — Progress Notes (Signed)
Subjective:     Patient ID: Justin Mcbride, male   DOB: [birth year], 57 y.o.   MRN: [id number]  HPI this 57 year old male returns for continued followup regarding his severe venous insufficiency of both lower extremities. He was evaluated by Dr. Johny Drilling 3 months ago and has been wearing long leg elastic compression stockings 20-30 mm gradient as well as trying elevation and ibuprofen on a daily basis. He continues to have severe aching throbbing and burning discomfort in both legs as the day progresses. He also has a history of venous stasis ulcer in the left ankle which is currently healed. He has had these varicose veins over the past 20-30 years and they have continuously enlarged and become more symptomatic.  Past Medical History  Diagnosis Date  . SOB (shortness of breath)   . Chest pain   . Varicose veins   . Neuropathy   . Allergy     History  Substance Use Topics  . Smoking status: Never Smoker   . Smokeless tobacco: Never Used  . Alcohol Use: No    Family History  Problem Relation Age of Onset  . Cancer Father     lung    No Known Allergies  Current outpatient prescriptions:omeprazole (PRILOSEC) 10 MG capsule, Take 4 capsules (40 mg total) by mouth daily., Disp: 30 capsule, Rfl: 1;  azithromycin (ZITHROMAX) 250 MG tablet, Take 2 tablets today followed by 1 tablet daily for next 4 days, Disp: 6 tablet, Rfl: 0;  cetirizine (ZYRTEC) 10 MG tablet, Take 1 tablet (10 mg total) by mouth at bedtime., Disp: 15 tablet, Rfl: 0 pregabalin (LYRICA) 50 MG capsule, Take 1 capsule (50 mg total) by mouth 3 (three) times daily., Disp: 90 capsule, Rfl: 0;  traMADol (ULTRAM) 50 MG tablet, Take 1 tablet (50 mg total) by mouth every 8 (eight) hours as needed for pain., Disp: 45 tablet, Rfl: 0;  [DISCONTINUED] famotidine (PEPCID) 20 MG tablet, Take 1 tablet (20 mg total) by mouth 2 (two) times daily., Disp: 30 tablet, Rfl: 2  BP 142/93  Pulse 71  Resp 18  Ht 6\' 1"  (1.854 m)  Wt 253 lb (114.76 kg)  BMI  33.39 kg/m2  Body mass index is 33.39 kg/(m^2).          Review of Systems denies chest pain, dyspnea on exertion, PND, orthopnea, hemoptysis    Objective:   Physical Exam BP 142/93  Pulse 71  Resp 18  Ht 6\' 1"  (1.854 m)  Wt 253 lb (114.76 kg)  BMI 33.39 kg/m2  General well-developed well-nourished male no apparent stress alert and oriented x3 Lungs no rhonchi or wheezing Bilateral lower extremities with severe extensive large bulging varicosities beginning in the distal thigh extending into the medial calf as well as posterior calf bilaterally. Evidence of healed ulcer left medial malleolar area. 3 posterior cells pedis pulse palpable bilaterally. 1+ chronic edema bilaterally with hyperpigmentation lower third of both legs no active ulcers.     Assessment:     Severe reflux bilateral great saphenous veins with painful bulging varicosities and history of healed stasis ulcer left ankle-symptoms not responding to conservative measures Patient needs #1 laser ablation left great saphenous vein with greater than 20 stab phlebectomy followed by #2 laser ablation right small saphenous vein followed by #3 laser ablation right great saphenous vein with greater than 20 stab phlebectomy Will proceed with precertification to perform this in the near future    Plan:     Plan to proceed to perform  laser ablation procedures as outlined above to  stabilize severe skin changes, prevent further ulceration, and relieve symptoms

## 2013-07-18 ENCOUNTER — Other Ambulatory Visit: Payer: Self-pay | Admitting: *Deleted

## 2013-07-18 DIAGNOSIS — I83893 Varicose veins of bilateral lower extremities with other complications: Secondary | ICD-10-CM

## 2013-08-18 ENCOUNTER — Encounter: Payer: Self-pay | Admitting: Vascular Surgery

## 2013-08-21 ENCOUNTER — Encounter: Payer: Self-pay | Admitting: Vascular Surgery

## 2013-08-21 ENCOUNTER — Ambulatory Visit (INDEPENDENT_AMBULATORY_CARE_PROVIDER_SITE_OTHER): Payer: Self-pay | Admitting: Vascular Surgery

## 2013-08-21 VITALS — BP 111/80 | HR 96 | Resp 18 | Ht 73.0 in | Wt 248.0 lb

## 2013-08-21 DIAGNOSIS — I83893 Varicose veins of bilateral lower extremities with other complications: Secondary | ICD-10-CM

## 2013-08-21 NOTE — Progress Notes (Signed)
   Laser Ablation Procedure      Date: 08/21/2013    Justin RiversBruce Poynor DOB:October 16, 1955  Consent signed: Yes  Surgeon:J.D. Hart RochesterLawson  Procedure: Laser Ablation: left Greater Saphenous Vein  BP 111/80  Pulse 96  Resp 18  Ht 6\' 1"  (1.854 m)  Wt 248 lb (112.492 kg)  BMI 32.73 kg/m2  Start time: 1:05p   End time: 2:40  Tumescent Anesthesia: 485 cc 0.9% NaCl with 50 cc Lidocaine HCL with 1% Epi and 15 cc 8.4% NaHCO3  Local Anesthesia: 11 cc Lidocaine HCL and NaHCO3 (ratio 2:1)  Pulsed mode: 17 watts, 500ms delay, 1.0 duration Total energy: 3036, total pulses: 179, total time: 2:59     Stab Phlebectomy: >20 Sites: Thigh and Calf  Patient tolerated procedure well: Yes  Notes:   Description of Procedure:  After marking the course of the saphenous vein and the secondary varicosities in the standing position, the patient was placed on the operating table in the supine position, and the left leg was prepped and draped in sterile fashion. Local anesthetic was administered, and under ultrasound guidance the saphenous vein was accessed with a micro needle and guide wire; then the micro puncture sheath was placed. A guide wire was inserted to the saphenofemoral junction, followed by a 5 french sheath.  The position of the sheath and then the laser fiber below the junction was confirmed using the ultrasound and visualization of the aiming beam.  Tumescent anesthesia was administered along the course of the saphenous vein using ultrasound guidance. Protective laser glasses were placed on the patient, and the laser was fired at 17 watts pulsed mode.  For a total of 3036 joules.  A steri strip was applied to the puncture site.  The patient was then put into Trendelenburg position.  Local anesthetic was utilized overlying the marked varicosities.  Greater than 20 stab wounds were made using the tip of an 11 blade; and using the vein hook,  The phlebectomies were performed using a hemostat to avulse these  varicosities.  Adequate hemostasis was achieved, and steri strips were applied to the stab wound.      ABD pads and thigh high compression stockings were applied.  Ace wrap bandages were applied over the phlebectomy sites and at the top of the saphenofemoral junction.  Blood loss was less than 15 cc.  The patient ambulated out of the operating room having tolerated the procedure well.

## 2013-08-21 NOTE — Progress Notes (Signed)
Subjective:     Patient ID: Justin RiversBruce Mcbride, male   DOB: Dec 05, 1955, 58 y.o.   MRN: [id number]  HPI this 58 year old male had laser ablation left great saphenous vein from the proximal calf to near the saphenofemoral junction plus approximately 30 stab phlebectomy of painful varicosities performed under local tumescent anesthesia. A total of 3036 J of energy was utilized. He tolerated the procedure well to   Review of Systems     Objective:   Physical Exam BP 111/80  Pulse 96  Resp 18  Ht 6\' 1"  (1.854 m)  Wt 248 lb (112.492 kg)  BMI 32.73 kg/m2       Assessment:     Well-tolerated laser ablation left great saphenous vein with greater than 20 stab phlebectomy painful varicosities performed under local tumescent anesthesia    Plan:     Return in one week for venous duplex exam to confirm closure left great saphenous vein We'll then proceed with laser ablation right small saphenous vein to be followed by laser ablation right great saphenous vein with multiple stab phlebectomy

## 2013-08-22 ENCOUNTER — Telehealth: Payer: Self-pay | Admitting: *Deleted

## 2013-08-22 ENCOUNTER — Encounter: Payer: Self-pay | Admitting: Vascular Surgery

## 2013-08-22 NOTE — Telephone Encounter (Signed)
Patient doing well. No bleeding from stab sites. Following all instructions.

## 2013-08-25 ENCOUNTER — Encounter: Payer: Self-pay | Admitting: Vascular Surgery

## 2013-08-28 ENCOUNTER — Encounter: Payer: Self-pay | Admitting: Vascular Surgery

## 2013-08-28 ENCOUNTER — Ambulatory Visit (HOSPITAL_COMMUNITY)
Admission: RE | Admit: 2013-08-28 | Discharge: 2013-08-28 | Disposition: A | Discharge status: Home / Self Care | Payer: No Typology Code available for payment source | Source: Ambulatory Visit | Attending: Vascular Surgery | Admitting: Vascular Surgery

## 2013-08-28 ENCOUNTER — Ambulatory Visit (INDEPENDENT_AMBULATORY_CARE_PROVIDER_SITE_OTHER): Payer: No Typology Code available for payment source | Admitting: Vascular Surgery

## 2013-08-28 VITALS — BP 125/92 | HR 102 | Resp 16 | Ht 73.0 in | Wt 246.0 lb

## 2013-08-28 DIAGNOSIS — I83893 Varicose veins of bilateral lower extremities with other complications: Secondary | ICD-10-CM

## 2013-08-28 DIAGNOSIS — Z09 Encounter for follow-up examination after completed treatment for conditions other than malignant neoplasm: Secondary | ICD-10-CM | POA: Insufficient documentation

## 2013-08-28 NOTE — Progress Notes (Signed)
Subjective:     Patient ID: Justin Mcbride, male   DOB: 1955/09/08, 58 y.o.   MRN: [id number]  HPI this 58 year old male is one week post laser ablation left great saphenous vein and proximally 30 stab phlebectomy of painful varicosities. He has had some mild/moderate discomfort along the course of the great saphenous vein. He has had no distal edema. Stab phlebectomy sites have not caused pain.    Review of Systems     Objective:   Physical Exam BP 125/92  Pulse 102  Resp 16  Ht 6\' 1"  (1.854 m)  Wt 246 lb (111.585 kg)  BMI 32.46 kg/m2  General well-developed well-nourished male no apparent stress alert and oriented x3 Lungs rhonchi or wheezing Left leg with moderate ecchymosis and discomfort along course of great saphenous vein from saphenofemoral junction to knee. Stab phlebotomy sites healing well. There is thrombosis of the great saphenous vein up to the saphenofemoral junction but there is not occlusion of the deep system.      Assessment:     Successful laser ablation left great saphenous vein with multiple stab phlebectomy for severe bulging varicosities    Plan:     Return next week for similar procedure on contralateral right leg

## 2013-09-01 ENCOUNTER — Encounter: Payer: Self-pay | Admitting: Vascular Surgery

## 2013-09-04 ENCOUNTER — Encounter: Payer: Self-pay | Admitting: Vascular Surgery

## 2013-09-04 ENCOUNTER — Ambulatory Visit (INDEPENDENT_AMBULATORY_CARE_PROVIDER_SITE_OTHER): Payer: No Typology Code available for payment source | Admitting: Vascular Surgery

## 2013-09-04 VITALS — BP 126/85 | HR 92 | Resp 16 | Ht 73.0 in | Wt 244.0 lb

## 2013-09-04 DIAGNOSIS — I83893 Varicose veins of bilateral lower extremities with other complications: Secondary | ICD-10-CM

## 2013-09-04 NOTE — Progress Notes (Signed)
   Laser Ablation Procedure      Date: 09/04/2013    Douglass RiversBruce Lesesne DOB:[birth year]  Consent signed: Yes  Surgeon:J.D. Hart RochesterLawson  Procedure: Laser Ablation: right Greater Saphenous Vein  BP 126/85  Pulse 92  Resp 16  Ht 6\' 1"  (1.854 m)  Wt 244 lb (110.678 kg)  BMI 32.20 kg/m2  Start time: 9:25   End time: 10:35  Tumescent Anesthesia: 450 cc 0.9% NaCl with 50 cc Lidocaine HCL with 1% Epi and 15 cc 8.4% NaHCO3  Local Anesthesia: 7 cc Lidocaine HCL and NaHCO3 (ratio 2:1)  Pulsed mode: 17 watts, 500 ms delay. 1.0 duration Total energy: 2003, total pulses: 119, total time: 1:58    Stab Phlebectomy: >20 Sites: Thigh and Calf  Patient tolerated procedure well: Yes  Notes:   Description of Procedure:  After marking the course of the saphenous vein and the secondary varicosities in the standing position, the patient was placed on the operating table in the supine position, and the right leg was prepped and draped in sterile fashion. Local anesthetic was administered, and under ultrasound guidance the saphenous vein was accessed with a micro needle and guide wire; then the micro puncture sheath was placed. A guide wire was inserted to the saphenofemoral junction, followed by a 5 french sheath.  The position of the sheath and then the laser fiber below the junction was confirmed using the ultrasound and visualization of the aiming beam.  Tumescent anesthesia was administered along the course of the saphenous vein using ultrasound guidance. Protective laser glasses were placed on the patient, and the laser was fired at 17 watts.  For a total of 2003 joules.  A steri strip was applied to the puncture site.  The patient was then put into Trendelenburg position.  Local anesthetic was utilized overlying the marked varicosities.  Greater than 20 stab wounds were made using the tip of an 11 blade; and using the vein hook,  The phlebectomies were performed using a hemostat to avulse these varicosities.   Adequate hemostasis was achieved, and steri strips were applied to the stab wound.      ABD pads and thigh high compression stockings were applied.  Ace wrap bandages were applied over the phlebectomy sites and at the top of the saphenofemoral junction.  Blood loss was less than 15 cc.  The patient ambulated out of the operating room having tolerated the procedure well.

## 2013-09-04 NOTE — Progress Notes (Signed)
Subjective:     Patient ID: Douglass RiversBruce Spearing, male   DOB: 1955-09-18, 58 y.o.   MRN: [id number]  HPI this 58 year old male had laser ablation right great saphenous vein with greater than 20 stab phlebectomy performed under local tumescent anesthesia for painful varicosities. He tolerated the procedure well. 2006 J of energy was utilized  Review of Systems     Objective:   Physical Exam BP 126/85  Pulse 92  Resp 16  Ht 6\' 1"  (1.854 m)  Wt 244 lb (110.678 kg)  BMI 32.20 kg/m2       Assessment:     Well-tolerated laser ablation right great saphenous vein plus greater than 20 stab phlebectomy of painful varicosities performed under local tumescent anesthesia    Plan:     Return in one week for venous duplex exam to confirm closure right great saphenous vein and this will complete treatment regimen

## 2013-09-05 ENCOUNTER — Encounter: Payer: Self-pay | Admitting: Vascular Surgery

## 2013-09-05 ENCOUNTER — Telehealth: Payer: Self-pay | Admitting: *Deleted

## 2013-09-05 NOTE — Telephone Encounter (Signed)
Asked him to call if having any problems or concerns

## 2013-09-08 ENCOUNTER — Telehealth: Payer: Self-pay | Admitting: *Deleted

## 2013-09-08 ENCOUNTER — Encounter: Payer: Self-pay | Admitting: Vascular Surgery

## 2013-09-08 NOTE — Telephone Encounter (Signed)
Left detailed voice phone message in response to pt.'s request wanting to delay venous duplex and VV Fu with Dr. Hart RochesterLawson on 09-11-2013 due to painful, sore right leg.  Explained importance of venous duplex to assess that GSV is closed and there are no problems/complications. Emphasized importance that he keep his appointment on 09-11-2013 for venous duplex and to see Dr. Hart RochesterLawson.  Recommended that he wear his compression hose as instructed, use ice compress over painful area right leg, elevate right leg when sitting, and increase Ibuprofen dosage to 800 mg (four 200mg  tablets) three times daily with meals.  Requested that he call me if he had further questions or concerns or if he was experiencing right ankle/foot pain or swelling.

## 2013-09-11 ENCOUNTER — Encounter: Payer: Self-pay | Admitting: Vascular Surgery

## 2013-09-11 ENCOUNTER — Ambulatory Visit (INDEPENDENT_AMBULATORY_CARE_PROVIDER_SITE_OTHER): Payer: Self-pay | Admitting: Vascular Surgery

## 2013-09-11 ENCOUNTER — Ambulatory Visit (HOSPITAL_COMMUNITY)
Admission: RE | Admit: 2013-09-11 | Discharge: 2013-09-11 | Disposition: A | Discharge status: Home / Self Care | Payer: No Typology Code available for payment source | Source: Ambulatory Visit | Attending: Vascular Surgery | Admitting: Vascular Surgery

## 2013-09-11 VITALS — BP 127/87 | HR 89 | Resp 16 | Ht 73.0 in | Wt 240.0 lb

## 2013-09-11 DIAGNOSIS — I83893 Varicose veins of bilateral lower extremities with other complications: Secondary | ICD-10-CM

## 2013-09-11 DIAGNOSIS — Z09 Encounter for follow-up examination after completed treatment for conditions other than malignant neoplasm: Secondary | ICD-10-CM | POA: Insufficient documentation

## 2013-09-11 NOTE — Progress Notes (Signed)
Subjective:     Patient ID: Douglass RiversBruce Sifuentes, male   DOB: 1956/03/29, 58 y.o.   MRN: [id number]  HPI this 58 year old male returns 1 week post laser ablation right great saphenous vein with greater than 30 stab phlebectomy of severe painful varicosities. He has had some mild/moderate discomfort in the thigh and calf area which is improving. He has taken ibuprofen as instructed and 1 his long-leg elastic compression stockings. The contralateral left leg continues to improve.  Review of Systems     Objective:   Physical Exam BP 127/87  Pulse 89  Resp 16  Ht 6\' 1"  (1.854 m)  Wt 240 lb (108.863 kg)  BMI 31.67 kg/m2  General well-developed well-nourished male no apparent stress alert and oriented x3 Lower extremities with tenderness along the course of great saphenous veins right greater than left. Stab tubectomy sites all healing satisfactorily with the posterior cells pedis pulse palpable bilaterally.  Today ordered a venous duplex exam which I reviewed and interpreted. Great saphenous vein the right leg is totally closed from distal thigh to near the saphenofemoral junction there is no DVT.      Assessment:     Successful bilateral laser ablation right and left great saphenous veins with multiple stab phlebectomy of large painful varicosities    Plan:     Will return to see us on when necessary basis

## 2013-09-13 ENCOUNTER — Other Ambulatory Visit: Payer: Self-pay | Admitting: Vascular Surgery

## 2013-09-13 DIAGNOSIS — I83893 Varicose veins of bilateral lower extremities with other complications: Secondary | ICD-10-CM

## 2014-02-07 ENCOUNTER — Emergency Department (HOSPITAL_COMMUNITY): Payer: No Typology Code available for payment source

## 2014-02-07 ENCOUNTER — Emergency Department (HOSPITAL_COMMUNITY): Payer: Self-pay

## 2014-02-07 ENCOUNTER — Emergency Department (HOSPITAL_COMMUNITY)
Admission: EM | Admit: 2014-02-07 | Discharge: 2014-02-07 | Disposition: A | Discharge status: Home / Self Care | Payer: No Typology Code available for payment source | Attending: Emergency Medicine | Admitting: Emergency Medicine

## 2014-02-07 ENCOUNTER — Encounter (HOSPITAL_COMMUNITY): Payer: Self-pay | Admitting: Emergency Medicine

## 2014-02-07 DIAGNOSIS — M25559 Pain in unspecified hip: Secondary | ICD-10-CM | POA: Insufficient documentation

## 2014-02-07 DIAGNOSIS — Z8679 Personal history of other diseases of the circulatory system: Secondary | ICD-10-CM | POA: Insufficient documentation

## 2014-02-07 DIAGNOSIS — R112 Nausea with vomiting, unspecified: Secondary | ICD-10-CM | POA: Insufficient documentation

## 2014-02-07 DIAGNOSIS — R42 Dizziness and giddiness: Secondary | ICD-10-CM | POA: Insufficient documentation

## 2014-02-07 DIAGNOSIS — Z79899 Other long term (current) drug therapy: Secondary | ICD-10-CM | POA: Insufficient documentation

## 2014-02-07 DIAGNOSIS — Z8669 Personal history of other diseases of the nervous system and sense organs: Secondary | ICD-10-CM | POA: Insufficient documentation

## 2014-02-07 LAB — CBC
HCT: 45.4 % (ref 39.0–52.0)
Hemoglobin: 15.3 g/dL (ref 13.0–17.0)
MCH: 29.1 pg (ref 26.0–34.0)
MCHC: 33.7 g/dL (ref 30.0–36.0)
MCV: 86.3 fL (ref 78.0–100.0)
PLATELETS: 200 10*3/uL (ref 150–400)
RBC: 5.26 MIL/uL (ref 4.22–5.81)
RDW: 13.2 % (ref 11.5–15.5)
WBC: 7 10*3/uL (ref 4.0–10.5)

## 2014-02-07 LAB — BASIC METABOLIC PANEL
Anion gap: 14 (ref 5–15)
BUN: 12 mg/dL (ref 6–23)
CO2: 26 mEq/L (ref 19–32)
Calcium: 9.3 mg/dL (ref 8.4–10.5)
Chloride: 100 mEq/L (ref 96–112)
Creatinine, Ser: 0.74 mg/dL (ref 0.50–1.35)
Glucose, Bld: 91 mg/dL (ref 70–99)
POTASSIUM: 4.1 meq/L (ref 3.7–5.3)
SODIUM: 140 meq/L (ref 137–147)

## 2014-02-07 LAB — I-STAT TROPONIN, ED: TROPONIN I, POC: 0 ng/mL (ref 0.00–0.08)

## 2014-02-07 MED ORDER — MECLIZINE HCL 25 MG PO TABS: 25.0000 mg | ORAL_TABLET | Freq: Three times a day (TID) | ORAL | Status: DC | PRN

## 2014-02-07 MED ORDER — TRAMADOL HCL 50 MG PO TABS: 50.0000 mg | ORAL_TABLET | Freq: Four times a day (QID) | ORAL | Status: DC | PRN

## 2014-02-07 NOTE — ED Notes (Signed)
Pt reports episodes of dizziness for several days. Reports last night at a meeting he went to push a table away from him and felt like table was continuing to move and felt dizzy. No neuro deficits noted at triage, ekg done.

## 2014-02-07 NOTE — ED Notes (Signed)
MD at bedside. 

## 2014-02-07 NOTE — Discharge Planning (Signed)
[facility] Community Liaison  Patient is a former orange Lexicographercard holder and patient at the [facility]. Patient states he has not been seen by the practice in some time. Liaison provided the patient with the orange card application. Patient was instructed to take the completed application to the clinic during the designated times to renew his orange card. My contact information was also provided for any future questions or concerns. No other Community Liaison needs identified at this time.

## 2014-02-07 NOTE — Discharge Instructions (Signed)
Workup here today without any significant abnormalities. MRI of the brain was normal. Pre-much ruling out most causes for the dizziness. Labs without specific abnormalities either. Hip x-rays were normal. Chest x-ray was normal 2. We'll have you try tramadol for the hip pain and will give a prescription for Antivert for the dizziness when it develops. Follow up with your doctor in the next few days.

## 2014-02-07 NOTE — ED Notes (Addendum)
Patient transported to CT 

## 2014-02-14 NOTE — ED Provider Notes (Signed)
CSN: [id number]     Arrival date & time 02/07/14  0913 History   First MD Initiated Contact with Patient 02/07/14 1006     Chief Complaint  Patient presents with  . Dizziness     (Consider location/radiation/quality/duration/timing/severity/associated sxs/prior Treatment) Patient is a 58 y.o. male presenting with dizziness. The history is provided by the patient.  Dizziness Associated symptoms: nausea and vomiting   Associated symptoms: no chest pain and no shortness of breath    patient onset of dizziness yesterday. No true vertigo. It seemed to be a little off balance. Also with complaint of bilateral hip pain right greater than left. These were present for several days. Has some nausea some vomiting. No fevers. No true vertigo.  Past Medical History  Diagnosis Date  . SOB (shortness of breath)   . Chest pain   . Varicose veins   . Neuropathy   . Allergy    Past Surgical History  Procedure Laterality Date  . Hernia repair    . Hernia repair    . Tonsillectomy     Family History  Problem Relation Age of Onset  . Cancer Father     lung   History  Substance Use Topics  . Smoking status: Never Smoker   . Smokeless tobacco: Never Used  . Alcohol Use: No    Review of Systems  Constitutional: Negative for fever.  HENT: Negative for congestion.   Eyes: Negative for visual disturbance.  Respiratory: Negative for shortness of breath.   Cardiovascular: Negative for chest pain.  Gastrointestinal: Positive for nausea and vomiting. Negative for abdominal pain.  Genitourinary: Negative for dysuria.  Musculoskeletal: Positive for arthralgias.  Skin: Negative for rash.  Neurological: Positive for dizziness.  Hematological: Does not bruise/bleed easily.  Psychiatric/Behavioral: Negative for confusion.      Allergies  Review of patient's allergies indicates no known allergies.  Home Medications   Prior to Admission medications   Medication Sig Start Date End Date  Taking? Authorizing Provider  esomeprazole (NEXIUM 24HR) 20 MG capsule Take 20 mg by mouth daily at 12 noon.   Yes Historical Provider, MD  meclizine (ANTIVERT) 25 MG tablet Take 1 tablet (25 mg total) by mouth 3 (three) times daily as needed for dizziness. 02/07/14   Vanetta Mulders, MD  traMADol (ULTRAM) 50 MG tablet Take 1 tablet (50 mg total) by mouth every 6 (six) hours as needed. 02/07/14   Vanetta Mulders, MD   BP 116/86  Pulse 64  Temp(Src) 98 F (36.7 C) (Oral)  Resp 18  SpO2 100% Physical Exam  Nursing note and vitals reviewed. Constitutional: He is oriented to person, place, and time. He appears well-developed and well-nourished. No distress.  HENT:  Head: Normocephalic and atraumatic.  Mouth/Throat: Oropharynx is clear and moist.  Eyes: Conjunctivae and EOM are normal. Pupils are equal, round, and reactive to light.  Neck: Normal range of motion.  Cardiovascular: Normal rate, regular rhythm and normal heart sounds.   No murmur heard. Pulmonary/Chest: Effort normal and breath sounds normal.  Abdominal: Soft. Bowel sounds are normal. There is no tenderness.  Musculoskeletal: Normal range of motion.  Neurological: He is alert and oriented to person, place, and time. No cranial nerve deficit. He exhibits normal muscle tone. Coordination normal.  Skin: Skin is warm. No rash noted.    ED Course  Procedures (including critical care time) Labs Review Labs Reviewed  CBC  BASIC METABOLIC PANEL  I-STAT TROPOININ, ED   Results for orders placed during  the hospital encounter of 02/07/14  CBC      Result Value Ref Range   WBC 7.0  4.0 - 10.5 K/uL   RBC 5.26  4.22 - 5.81 MIL/uL   Hemoglobin 15.3  13.0 - 17.0 g/dL   HCT 16.145.4  09.639.0 - 04.552.0 %   MCV 86.3  78.0 - 100.0 fL   MCH 29.1  26.0 - 34.0 pg   MCHC 33.7  30.0 - 36.0 g/dL   RDW 40.913.2  81.111.5 - 91.415.5 %   Platelets 200  150 - 400 K/uL  BASIC METABOLIC PANEL      Result Value Ref Range   Sodium 140  137 - 147 mEq/L   Potassium  4.1  3.7 - 5.3 mEq/L   Chloride 100  96 - 112 mEq/L   CO2 26  19 - 32 mEq/L   Glucose, Bld 91  70 - 99 mg/dL   BUN 12  6 - 23 mg/dL   Creatinine, Ser 7.820.74  0.50 - 1.35 mg/dL   Calcium 9.3  8.4 - 95.610.5 mg/dL   GFR calc non Af Amer >90  >90 mL/min   GFR calc Af Amer >90  >90 mL/min   Anion gap 14  5 - 15  I-STAT TROPOININ, ED      Result Value Ref Range   Troponin i, poc 0.00  0.00 - 0.08 ng/mL   Comment 3            Results for orders placed during the hospital encounter of 02/07/14  CBC      Result Value Ref Range   WBC 7.0  4.0 - 10.5 K/uL   RBC 5.26  4.22 - 5.81 MIL/uL   Hemoglobin 15.3  13.0 - 17.0 g/dL   HCT 21.345.4  08.639.0 - 57.852.0 %   MCV 86.3  78.0 - 100.0 fL   MCH 29.1  26.0 - 34.0 pg   MCHC 33.7  30.0 - 36.0 g/dL   RDW 46.913.2  62.911.5 - 52.815.5 %   Platelets 200  150 - 400 K/uL  BASIC METABOLIC PANEL      Result Value Ref Range   Sodium 140  137 - 147 mEq/L   Potassium 4.1  3.7 - 5.3 mEq/L   Chloride 100  96 - 112 mEq/L   CO2 26  19 - 32 mEq/L   Glucose, Bld 91  70 - 99 mg/dL   BUN 12  6 - 23 mg/dL   Creatinine, Ser 4.130.74  0.50 - 1.35 mg/dL   Calcium 9.3  8.4 - 24.410.5 mg/dL   GFR calc non Af Amer >90  >90 mL/min   GFR calc Af Amer >90  >90 mL/min   Anion gap 14  5 - 15  I-STAT TROPOININ, ED      Result Value Ref Range   Troponin i, poc 0.00  0.00 - 0.08 ng/mL   Comment 3            Dg Chest 2 View  02/07/2014   CLINICAL DATA:  DIZZINESS  EXAM: CHEST  2 VIEW  COMPARISON:  Two-view chest 05/23/2013  FINDINGS: The heart size and mediastinal contours are within normal limits. Both lungs are clear. The visualized skeletal structures are unremarkable.  IMPRESSION: No active cardiopulmonary disease.   Electronically Signed   By: Salome HolmesHector  Cooper M.D.   On: 02/07/2014 10:03   Dg Hip Bilateral W/pelvis  02/07/2014   CLINICAL DATA:  Hip pain  EXAM: BILATERAL HIP WITH  PELVIS - 4+ VIEW  COMPARISON:  None.  FINDINGS: The pelvic ring is intact. No acute fracture dislocation is noted. No gross  soft tissue abnormality is seen.  IMPRESSION: No acute abnormality noted.   Electronically Signed   By: Alcide Clever M.D.   On: 02/07/2014 11:44   Ct Head Wo Contrast  02/07/2014   CLINICAL DATA:  Dizziness  EXAM: CT HEAD WITHOUT CONTRAST  TECHNIQUE: Contiguous axial images were obtained from the base of the skull through the vertex without intravenous contrast.  COMPARISON:  None.  FINDINGS: The bony calvarium is intact. No gross soft tissue abnormality is seen. Very mild atrophic changes are noted. No findings to suggest acute hemorrhage, acute infarction or space-occupying mass lesion are noted.  IMPRESSION: No acute abnormality noted.   Electronically Signed   By: Alcide Clever M.D.   On: 02/07/2014 11:33   Mr Brain Wo Contrast  02/07/2014   CLINICAL DATA:  Dizziness  EXAM: MRI HEAD WITHOUT CONTRAST  TECHNIQUE: Multiplanar, multiecho pulse sequences of the brain and surrounding structures were obtained without intravenous contrast.  COMPARISON:  CT head 02/07/2014  FINDINGS: Negative for acute infarct.  Ventricle size is normal. Cerebral volume is normal. Craniocervical junction normal. Pituitary normal in size.  Small hyperintensities in the frontal subcortical white matter bilaterally, nonspecific but likely due to chronic microvascular ischemia. Brainstem and cerebellum are normal. Basal ganglia normal.  Negative for hemorrhage or mass.  Paranasal sinuses are clear.  IMPRESSION: No acute abnormality. Minimal chronic white matter changes likely related to microvascular ischemia.   Electronically Signed   By: Marlan Palau M.D.   On: 02/07/2014 15:07      Imaging Review No results found.   EKG Interpretation   Date/Time:  Wednesday February 07 2014 09:19:34 EDT Ventricular Rate:  72 PR Interval:  218 QRS Duration: 94 QT Interval:  376 QTC Calculation: 411 R Axis:   48 Text Interpretation:  Sinus rhythm with sinus arrhythmia with 1st degree  A-V block with occasional Premature ventricular  complexes and Fusion  complexes Inferior infarct , age undetermined Abnormal ECG Confirmed by  Annalynn Centanni  MD, Linsey Arteaga ([id number]) on 02/07/2014 10:22:49 AM      MDM   Final diagnoses:  Dizziness  Hip pain, unspecified laterality    Patient presented with multiple complaints. Main one was dizziness that started yesterday. No true vertigo. Also bilateral hip pain for some time) of the left. Also of some nausea and vomiting. Patient without any neuro deficits. No complaints to include chest pain. X-rays of both hips without any significant findings. EKG without acute arrhythmia. Troponin was negative. Electrolytes normal his no leukocytosis no anemia. Patient discharged with followup with her primary care Dr. Maryland Pink CT without any significant abnormalities. This was followed up with an MRI that also had no significant abnormalities.    Vanetta Mulders, MD 02/14/14 [phone]

## 2014-03-26 ENCOUNTER — Ambulatory Visit: Payer: No Typology Code available for payment source | Attending: Internal Medicine

## 2014-08-22 ENCOUNTER — Encounter: Payer: Self-pay | Admitting: Cardiovascular Disease

## 2015-06-12 ENCOUNTER — Emergency Department (HOSPITAL_COMMUNITY)
Admission: EM | Admit: 2015-06-12 | Discharge: 2015-06-12 | Disposition: A | Discharge status: Home / Self Care | Payer: Self-pay | Attending: Emergency Medicine | Admitting: Emergency Medicine

## 2015-06-12 ENCOUNTER — Encounter (HOSPITAL_COMMUNITY): Payer: Self-pay | Admitting: Emergency Medicine

## 2015-06-12 ENCOUNTER — Emergency Department (HOSPITAL_COMMUNITY): Payer: No Typology Code available for payment source

## 2015-06-12 DIAGNOSIS — K8020 Calculus of gallbladder without cholecystitis without obstruction: Secondary | ICD-10-CM | POA: Insufficient documentation

## 2015-06-12 DIAGNOSIS — K449 Diaphragmatic hernia without obstruction or gangrene: Secondary | ICD-10-CM | POA: Insufficient documentation

## 2015-06-12 DIAGNOSIS — K8050 Calculus of bile duct without cholangitis or cholecystitis without obstruction: Secondary | ICD-10-CM

## 2015-06-12 LAB — COMPREHENSIVE METABOLIC PANEL
ALT: 36 U/L (ref 17–63)
AST: 32 U/L (ref 15–41)
Albumin: 4 g/dL (ref 3.5–5.0)
Alkaline Phosphatase: 100 U/L (ref 38–126)
Anion gap: 9 (ref 5–15)
BUN: 8 mg/dL (ref 6–20)
CHLORIDE: 101 mmol/L (ref 101–111)
CO2: 27 mmol/L (ref 22–32)
CREATININE: 0.9 mg/dL (ref 0.61–1.24)
Calcium: 9.1 mg/dL (ref 8.9–10.3)
GFR calc Af Amer: >60 mL/min (ref >60)
GFR calc non Af Amer: >60 mL/min (ref >60)
Glucose, Bld: 123 mg/dL — ABNORMAL HIGH (ref 65–99)
Potassium: 3.7 mmol/L (ref 3.5–5.1)
SODIUM: 137 mmol/L (ref 135–145)
Total Bilirubin: 1.2 mg/dL (ref 0.3–1.2)
Total Protein: 6.9 g/dL (ref 6.5–8.1)

## 2015-06-12 LAB — CBC
HCT: 44.3 % (ref 39.0–52.0)
HEMOGLOBIN: 14.7 g/dL (ref 13.0–17.0)
MCH: 29.3 pg (ref 26.0–34.0)
MCHC: 33.2 g/dL (ref 30.0–36.0)
MCV: 88.2 fL (ref 78.0–100.0)
PLATELETS: 217 10*3/uL (ref 150–400)
RBC: 5.02 MIL/uL (ref 4.22–5.81)
RDW: 12.9 % (ref 11.5–15.5)
WBC: 9.7 10*3/uL (ref 4.0–10.5)

## 2015-06-12 LAB — URINALYSIS, ROUTINE W REFLEX MICROSCOPIC
Bilirubin Urine: NEGATIVE
Glucose, UA: NEGATIVE mg/dL
Hgb urine dipstick: NEGATIVE
Ketones, ur: NEGATIVE mg/dL
LEUKOCYTES UA: NEGATIVE
Nitrite: NEGATIVE
PH: 5 (ref 5.0–8.0)
Protein, ur: NEGATIVE mg/dL
Specific Gravity, Urine: 1.022 (ref 1.005–1.030)
Urobilinogen, UA: 0.2 mg/dL (ref 0.0–1.0)

## 2015-06-12 LAB — TROPONIN I: Troponin I: <0.03 ng/mL (ref <0.031)

## 2015-06-12 LAB — LIPASE, BLOOD: Lipase: 22 U/L (ref 11–51)

## 2015-06-12 MED ORDER — SODIUM CHLORIDE 0.9 % IV SOLN
1000.0000 mL | INTRAVENOUS | Status: DC
  Administered 2015-06-12: 1000 mL via INTRAVENOUS

## 2015-06-12 MED ORDER — HYDROCODONE-ACETAMINOPHEN 5-325 MG PO TABS
2.0000 | ORAL_TABLET | Freq: Once | ORAL | Status: AC
  Administered 2015-06-12: 2 via ORAL
  Filled 2015-06-12: qty 2

## 2015-06-12 MED ORDER — SODIUM CHLORIDE 0.9 % IV SOLN
1000.0000 mL | Freq: Once | INTRAVENOUS | Status: AC
  Administered 2015-06-12: 1000 mL via INTRAVENOUS

## 2015-06-12 MED ORDER — MORPHINE SULFATE (PF) 4 MG/ML IV SOLN
4.0000 mg | Freq: Once | INTRAVENOUS | Status: AC
  Administered 2015-06-12: 4 mg via INTRAVENOUS
  Filled 2015-06-12: qty 1

## 2015-06-12 MED ORDER — SUCRALFATE 1 GM/10ML PO SUSP: 1.0000 g | Freq: Three times a day (TID) | ORAL | Status: DC

## 2015-06-12 MED ORDER — IOHEXOL 300 MG/ML  SOLN
100.0000 mL | Freq: Once | INTRAMUSCULAR | Status: AC | PRN
  Administered 2015-06-12: 100 mL via INTRAVENOUS

## 2015-06-12 MED ORDER — HYDROCODONE-ACETAMINOPHEN 5-325 MG PO TABS: 1.0000 | ORAL_TABLET | ORAL | Status: DC | PRN

## 2015-06-12 MED ORDER — DOCUSATE SODIUM 100 MG PO CAPS: 100.0000 mg | ORAL_CAPSULE | Freq: Every day | ORAL | Status: DC

## 2015-06-12 MED ORDER — ESOMEPRAZOLE MAGNESIUM 40 MG PO CPDR: 40.0000 mg | DELAYED_RELEASE_CAPSULE | Freq: Every day | ORAL | Status: AC

## 2015-06-12 MED ORDER — SUCRALFATE 1 G PO TABS
1.0000 g | ORAL_TABLET | Freq: Once | ORAL | Status: AC
  Administered 2015-06-12: 1 g via ORAL
  Filled 2015-06-12: qty 1

## 2015-06-12 NOTE — Discharge Instructions (Signed)
Cholelithiasis Cholelithiasis (also called gallstones) is a form of gallbladder disease in which gallstones form in your gallbladder. The gallbladder is an organ that stores bile made in the liver, which helps digest fats. Gallstones begin as small crystals and slowly grow into stones. Gallstone pain occurs when the gallbladder spasms and a gallstone is blocking the duct. Pain can also occur when a stone passes out of the duct.  RISK FACTORS  Being male.   Having multiple pregnancies. Health care providers sometimes advise removing diseased gallbladders before future pregnancies.   Being obese.  Eating a diet heavy in fried foods and fat.   Being older than 60 years and increasing age.   Prolonged use of medicines containing male hormones.   Having diabetes mellitus.   Rapidly losing weight.   Having a family history of gallstones (heredity).  SYMPTOMS  Nausea.   Vomiting.  Abdominal pain.   Yellowing of the skin (jaundice).   Sudden pain. It may persist from several minutes to several hours.  Fever.   Tenderness to the touch. In some cases, when gallstones do not move into the bile duct, people have no pain or symptoms. These are called "silent" gallstones.  TREATMENT Silent gallstones do not need treatment. In severe cases, emergency surgery may be required. Options for treatment include:  Surgery to remove the gallbladder. This is the most common treatment.  Medicines. These do not always work and may take 6-12 months or more to work.  Shock wave treatment (extracorporeal biliary lithotripsy). In this treatment an ultrasound machine sends shock waves to the gallbladder to break gallstones into smaller pieces that can pass into the intestines or be dissolved by medicine. HOME CARE INSTRUCTIONS   Only take over-the-counter or prescription medicines for pain, discomfort, or fever as directed by your health care provider.   Follow a low-fat diet until  seen again by your health care provider. Fat causes the gallbladder to contract, which can result in pain.   Follow up with your health care provider as directed. Attacks are almost always recurrent and surgery is usually required for permanent treatment.  SEEK IMMEDIATE MEDICAL CARE IF:   Your pain increases and is not controlled by medicines.   You have a fever or persistent symptoms for more than 2-3 days.   You have a fever and your symptoms suddenly get worse.   You have persistent nausea and vomiting.  MAKE SURE YOU:   Understand these instructions.  Will watch your condition.  Will get help right away if you are not doing well or get worse.   This information is not intended to replace advice given to you by your health care provider. Make sure you discuss any questions you have with your health care provider.   Document Released: 07/16/2005 Document Revised: 03/22/2013 Document Reviewed: 01/11/2013 Elsevier Interactive Patient Education 2016 [employer] Hiatal Hernia A hiatal hernia occurs when part of your stomach slides above the muscle that separates your abdomen from your chest (diaphragm). You can be born with a hiatal hernia (congenital), or it may develop over time. In almost all cases of hiatal hernia, only the top part of the stomach pushes through.  Many people have a hiatal hernia with no symptoms. The larger the hernia, the more likely that you will have symptoms. In some cases, a hiatal hernia allows stomach acid to flow back into the tube that carries food from your mouth to your stomach (esophagus). This may cause heartburn symptoms. Severe heartburn symptoms  may mean you have developed a condition called gastroesophageal reflux disease (GERD).  CAUSES  Hiatal hernias are caused by a weakness in the opening (hiatus) where your esophagus passes through your diaphragm to attach to the upper part of your stomach. You may be born with a weakness in your hiatus,  or a weakness can develop. RISK FACTORS Older age is a major risk factor for a hiatal hernia. Anything that increases pressure on your diaphragm can also increase your risk of a hiatal hernia. This includes:  Pregnancy.  Excess weight.  Frequent constipation. SIGNS AND SYMPTOMS  People with a hiatal hernia often have no symptoms. If symptoms develop, they are almost always caused by GERD. They may include:  Heartburn.  Belching.  Indigestion.  Trouble swallowing.  Coughing or wheezing.  Sore throat.  Hoarseness.  Chest pain. DIAGNOSIS  A hiatal hernia is sometimes found during an exam for another problem. Your health care provider may suspect a hiatal hernia if you have symptoms of GERD. Tests may be done to diagnose GERD. These may include:  X-rays of your stomach or chest.  An upper gastrointestinal (GI) series. This is an X-ray exam of your GI tract involving the use of a chalky liquid that you swallow. The liquid shows up clearly on the X-ray.  Endoscopy. This is a procedure to look into your stomach using a thin, flexible tube that has a tiny camera and light on the end of it. TREATMENT  If you have no symptoms, you may not need treatment. If you have symptoms, treatment may include:  Dietary and lifestyle changes to help reduce GERD symptoms.  Medicines. These may include:  Over-the-counter antacids.  Medicines that make your stomach empty more quickly.  Medicines that block the production of stomach acid (H2 blockers).  Stronger medicines to reduce stomach acid (proton pump inhibitors).  You may need surgery to repair the hernia if other treatments are not helping. HOME CARE INSTRUCTIONS   Take all medicines as directed by your health care provider.  Quit smoking, if you smoke.  Try to achieve and maintain a healthy body weight.  Eat frequent small meals instead of three large meals a day. This keeps your stomach from getting too full.  Eat  slowly.  Do not lie down right after eating.  Do noteat 1-2 hours before bed.   Do not drink beverages with caffeine. These include cola, coffee, cocoa, and tea.  Do not drink alcohol.  Avoid foods that can make symptoms of GERD worse. These may include:  Fatty foods.  Citrus fruits.  Other foods and drinks that contain acid.  Avoid putting pressure on your belly. Anything that puts pressure on your belly increases the amount of acid that may be pushed up into your esophagus.   Avoid bending over, especially after eating.  Raise the head of your bed by putting blocks under the legs. This keeps your head and esophagus higher than your stomach.  Do not wear tight clothing around your chest or stomach.  Try not to strain when having a bowel movement, when urinating, or when lifting heavy objects. SEEK MEDICAL CARE IF:  Your symptoms are not controlled with medicines or lifestyle changes.  You are having trouble swallowing.  You have coughing or wheezing that will not go away. SEEK IMMEDIATE MEDICAL CARE IF:  Your pain is getting worse.  Your pain spreads to your arms, neck, jaw, teeth, or back.  You have shortness of breath.  You sweat  for no reason.  You feel sick to your stomach (nauseous) or vomit.  You vomit blood.  You have bright red blood in your stools.  You have black, tarry stools.    This information is not intended to replace advice given to you by your health care provider. Make sure you discuss any questions you have with your health care provider.   Document Released: 10/10/2003 Document Revised: 08/10/2014 Document Reviewed: 07/07/2013 Elsevier Interactive Patient Education [employer]

## 2015-06-12 NOTE — ED Provider Notes (Signed)
CSN: [id number]     Arrival date & time 06/12/15  0125 History   By signing my name below, I, Arlan Organ, attest that this documentation has been prepared under the direction and in the presence of Arby Barrette, MD.  Electronically Signed: Arlan Organ, ED Scribe. 06/12/2015. 2:07 AM.   Chief Complaint  Patient presents with  . Abdominal Pain   HPI  HPI Comments: Hendricks Schwandt is a 59 y.o. male who presents to the Emergency Department complaining of intermittent, ongoing diffuse abdominal pain x 3 days; worsened this evening. Pt states "it feels like i need to use the bathroom but cant". Ongoing nausea, myalgias, diarrhea, and low grade fever also reported. No aggravating or alleviating factors at this time. Nexium, Ibuprofen, and Imodium attempted prior to arrival without any improvement. No recent chills, vomiting, chest pain, or shortness of breath. Mr. Rubenstein admits to a history of same as he experienced the same last month. However, no evaluation at that time.  PCP: Jeanann Lewandowsky, MD    Past Medical History  Diagnosis Date  . SOB (shortness of breath)   . Chest pain   . Varicose veins   . Neuropathy (HCC)   . Allergy    Past Surgical History  Procedure Laterality Date  . Hernia repair    . Hernia repair    . Tonsillectomy     Family History  Problem Relation Age of Onset  . Cancer Father     lung   Social History  Substance Use Topics  . Smoking status: Never Smoker   . Smokeless tobacco: Never Used  . Alcohol Use: No    Review of Systems  A complete 10 system review of systems was obtained and all systems are negative except as noted in the HPI and PMH.    Allergies  Review of patient's allergies indicates no known allergies.  Home Medications   Prior to Admission medications   Medication Sig Start Date End Date Taking? Authorizing Provider  docusate sodium (COLACE) 100 MG capsule Take 1 capsule (100 mg total) by mouth daily. If you tend to get  constipation and are taking a narcotic medication such as Vicodin, take a Colace daily to prevent constipation. 06/12/15   Arby Barrette, MD  esomeprazole (NEXIUM) 40 MG capsule Take 1 capsule (40 mg total) by mouth daily. 06/12/15   Arby Barrette, MD  HYDROcodone-acetaminophen (NORCO/VICODIN) 5-325 MG tablet Take 1-2 tablets by mouth every 4 (four) hours as needed for moderate pain or severe pain. 06/12/15   Arby Barrette, MD  meclizine (ANTIVERT) 25 MG tablet Take 1 tablet (25 mg total) by mouth 3 (three) times daily as needed for dizziness. Patient not taking: Reported on 06/12/2015 02/07/14   Vanetta Mulders, MD  sucralfate (CARAFATE) 1 GM/10ML suspension Take 10 mLs (1 g total) by mouth 4 (four) times daily -  with meals and at bedtime. 06/12/15   Arby Barrette, MD  traMADol (ULTRAM) 50 MG tablet Take 1 tablet (50 mg total) by mouth every 6 (six) hours as needed. Patient not taking: Reported on 06/12/2015 02/07/14   Vanetta Mulders, MD   Triage Vitals: BP 118/78 mmHg  Pulse 64  Temp(Src) 98.4 F (36.9 C) (Oral)  Resp 20  Ht  (1.854 m)  Wt 253 lb (114.76 kg)  BMI 33.39 kg/m2  SpO2 97%   Physical Exam  Constitutional: He is oriented to person, place, and time. He appears well-developed and well-nourished.  HENT:  Head: Normocephalic and atraumatic.  Eyes: EOM are normal. Pupils are equal, round, and reactive to light.  Neck: Neck supple.  Cardiovascular: Normal rate, regular rhythm, normal heart sounds and intact distal pulses.   Pulmonary/Chest: Effort normal and breath sounds normal.  Abdominal: Soft. Bowel sounds are normal. He exhibits no distension. There is tenderness.  Epigastric tenderness to palpation. No guarding or rebound.  Musculoskeletal: Normal range of motion. He exhibits no edema.  Extensive varicosities of the lower extremities but no significant edema. No cellulitis.  Neurological: He is alert and oriented to person, place, and time. He has normal strength.  Coordination normal. GCS eye subscore is 4. GCS verbal subscore is 5. GCS motor subscore is 6.  Skin: Skin is warm, dry and intact.  Psychiatric: He has a normal mood and affect.    ED Course  Procedures (including critical care time)  DIAGNOSTIC STUDIES: Oxygen Saturation is 97% on RA, adequate by my interpretation.    COORDINATION OF CARE: 2:05 AM- Will order Lipase, CMP, CBC, and urinalysis. Discussed treatment plan with pt at bedside and pt agreed to plan.     Labs Review Labs Reviewed  COMPREHENSIVE METABOLIC PANEL - Abnormal; Notable for the following:    Glucose, Bld 123 (*)    All other components within normal limits  URINALYSIS, ROUTINE W REFLEX MICROSCOPIC (NOT AT [facility]) - Abnormal; Notable for the following:    APPearance HAZY (*)    All other components within normal limits  LIPASE, BLOOD  CBC  TROPONIN I    Imaging Review Ct Abdomen Pelvis W Contrast  06/12/2015  CLINICAL DATA:  Pain across the abdomen with nausea and body aches. Low-grade fever and diarrhea since Saturday. EXAM: CT ABDOMEN AND PELVIS WITH CONTRAST TECHNIQUE: Multidetector CT imaging of the abdomen and pelvis was performed using the standard protocol following bolus administration of intravenous contrast. CONTRAST:  100mL OMNIPAQUE IOHEXOL 300 MG/ML  SOLN COMPARISON:  None. FINDINGS: Lower chest and abdominal wall: Coronary atherosclerosis, known from coronary CTA in 2012. Hepatobiliary: A 10 mm mass in segment 3 has peripheral discontinuous nodular enhancement, most consistent with hemangioma.Cholelithiasis, some containing gas, without evidence of biliary inflammation. Normal common bile duct diameter Pancreas: Unremarkable. Spleen: Unremarkable. Adrenals/Urinary Tract: Negative adrenals. No hydronephrosis or stone. 1 cm cyst in the lower left renal hilum. Unremarkable bladder. Reproductive:No pathologic findings. Stomach/Bowel: Moderate sliding-type hiatal hernia. No bowel obstruction. No appendicitis.  Vascular/Lymphatic: No acute vascular abnormality. No mass or adenopathy. Peritoneal: No ascites or pneumoperitoneum. Musculoskeletal: No acute abnormalities. IMPRESSION: 1. No acute finding. 2. Cholelithiasis. 3. 10 mm left liver mass, appearance consistent with hemangioma. 4. Moderate sliding hiatal hernia. Electronically Signed   By: Marnee SpringJonathon  Watts M.D.   On: 06/12/2015 04:14   I have personally reviewed and evaluated these images and lab results as part of my medical decision-making.   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 Confirmed by Donnald GarrePfeiffer, MD, Lebron ConnersMarcy [phone]([id number]) on  06/12/2015 3:09:53 AM      MDM   Final diagnoses:  Biliary colic  Hiatal hernia    Patient has been getting episodic abdominal discomfort that is predominantly upper with bloating sensation. Both hiatal hernia and biliary colic are likely etiologies. The patient is nontoxic with a nonsurgical abdomen. He is counseled  on surgical follow-up for biliary colic and initiating Carafate and increasing his Nexium to 40 mg daily for hiatal hernia. He is counseled on signs and symptoms for which to return.  Arby Barrette, MD 06/12/15 [phone]

## 2015-06-12 NOTE — ED Notes (Signed)
Patient verbalized understanding of discharge instructions and prescription medications and denies any further needs or questions at this time.  

## 2015-06-12 NOTE — ED Notes (Signed)
Pt. reports pain across his abdomen with nausea , body aches , low grade fever and diarrhea onset Saturday .

## 2015-06-25 ENCOUNTER — Other Ambulatory Visit: Payer: Self-pay | Admitting: General Surgery

## 2018-02-01 ENCOUNTER — Ambulatory Visit
Admission: RE | Admit: 2018-02-01 | Discharge: 2018-02-01 | Disposition: A | Discharge status: Home / Self Care | Payer: No Typology Code available for payment source | Source: Ambulatory Visit | Attending: Cardiovascular Disease | Admitting: Cardiovascular Disease

## 2018-02-01 ENCOUNTER — Other Ambulatory Visit: Payer: Self-pay | Admitting: Cardiovascular Disease

## 2018-02-01 DIAGNOSIS — R079 Chest pain, unspecified: Secondary | ICD-10-CM

## 2018-03-10 ENCOUNTER — Ambulatory Visit
Admission: RE | Admit: 2018-03-10 | Discharge: 2018-03-10 | Disposition: A | Discharge status: Home / Self Care | Payer: Self-pay | Source: Ambulatory Visit | Attending: Cardiovascular Disease | Admitting: Cardiovascular Disease

## 2018-03-10 ENCOUNTER — Other Ambulatory Visit: Payer: Self-pay | Admitting: Cardiovascular Disease

## 2018-03-10 DIAGNOSIS — R202 Paresthesia of skin: Secondary | ICD-10-CM

## 2018-03-10 DIAGNOSIS — R200 Anesthesia of skin: Secondary | ICD-10-CM

## 2018-04-19 ENCOUNTER — Ambulatory Visit (INDEPENDENT_AMBULATORY_CARE_PROVIDER_SITE_OTHER): Payer: Self-pay | Admitting: Podiatry

## 2018-04-19 ENCOUNTER — Encounter: Payer: Self-pay | Admitting: Podiatry

## 2018-04-19 ENCOUNTER — Telehealth: Payer: Self-pay | Admitting: *Deleted

## 2018-04-19 VITALS — BP 154/96 | HR 78 | Temp 98.4°F

## 2018-04-19 DIAGNOSIS — G629 Polyneuropathy, unspecified: Secondary | ICD-10-CM

## 2018-04-19 DIAGNOSIS — K219 Gastro-esophageal reflux disease without esophagitis: Secondary | ICD-10-CM | POA: Insufficient documentation

## 2018-04-19 DIAGNOSIS — M79605 Pain in left leg: Secondary | ICD-10-CM

## 2018-04-19 DIAGNOSIS — M79604 Pain in right leg: Secondary | ICD-10-CM

## 2018-04-19 MED ORDER — TRAMADOL HCL 50 MG PO TABS: 50.0000 mg | ORAL_TABLET | Freq: Two times a day (BID) | ORAL | 0 refills | Status: AC | PRN

## 2018-04-19 NOTE — Telephone Encounter (Signed)
-----   Message from Vivi BarrackMatthew R Wagoner, DPM sent at 04/19/2018  1:25 PM EDT ----- Can you please order a NCV to look for neuropathy? Thanks.

## 2018-04-19 NOTE — Telephone Encounter (Signed)
Faxed order for NCV [facility].

## 2018-04-19 NOTE — Patient Instructions (Signed)
Focal Neuropathy  Focal neuropathy is a nerve injury that affects one area of the body, such as an arm, a leg, or the face. The injury may involve one nerve or a small group of nerves. Examples of focal neuropathy include brachial plexus injury and [place]-Turner syndrome.  What are the causes?  This condition may be caused by:   A sudden cut or stretch. This can happen because of an accident or during surgery.   A small injury that happens again and again.   A compression injury. This kind of injury happens when a blood vessel, a tumor, or something else presses on a nerve for a long time.   Entrapment. This can happen to a nerve that has to pass through a narrow area.   Lack of blood to the nerves. This can be caused by certain medical conditions, like diabetes or vasculitis.   Extreme cold.   Severe burns.   Radiation.    What are the signs or symptoms?  Symptoms of this condition depend on where the damaged nerve is and what kind of nerve it is. For example, damage to nerves that carry signals away from the brain can cause symptoms related to movement, but damage to nerves that carry signals to the brain can cause symptoms related to feeling and pain.  Symptoms affect only one area of the body. Common symptoms include:   Numbness.   Tingling.   A burning pain.   A prickling feeling.   Very sensitive skin.   Weakness.   Paralysis.   Muscle twitching.   Muscles getting smaller (muscle wasting).   Poor coordination.    How is this diagnosed?  This condition may be diagnosed with:   A neurologic exam. During the exam, your health care provider will check your reflexes, how you move, and what you can feel.   Tests. Tests may be done to help find the area that has been damaged. Tests may include:  ? Imaging tests, such as a CT scan or MRI.  ? Electromyogram (EMG). This test checks the nerves that control your muscles.  ? Nerve conduction velocity (NCV) tests. These tests check how quickly  messages pass through your nerves.    How is this treated?  Treatment for this condition depends on what damaged the nerve and how much of the nerve is damaged. Treatment may involve:   Surgery to ease pressure on a nerve. This may be done if you have a compression injury or entrapment.   Medicines, such as:  ? Medicines for pain, such painkillers, certain anti-seizure medicines, or antidepressants.  ? Steroid medicines. These may be given in pill form or with a shot (injection).   Physical therapy (PT) to help movement.   Splints or other devices to help movement.    Follow these instructions at home:     Take over-the-counter and prescription medicines only as told by your health care provider.   If you were given a splint or other device to help with movement, use it as told by your health care provider.   If any part of your body is numb, check it every day for signs of injury or infection. Watch for:  ? Redness, swelling, or pain.  ? Fluid or blood.  ? Warmth.  ? Pus or a bad smell.   Do not do things that put pressure on your damaged nerve. You may have to avoid certain activities or avoid sitting or lying in a certain way.     with focal neuropathy can be stressful. Talk with a mental health caregiver or join a support group if you are struggling.  Keep all follow-up visits as told by your health care provider. This is important. These include PT visits. Contact a health care provider if:  You think you have an injury or infection.  You have new symptoms.  You are struggling emotionally from dealing with your condition. Get help right away if:  You have severe pain that comes on  suddenly.  You develop any paralysis.  You lose feeling in any part of your body. This information is not intended to replace advice given to you by your health care provider. Make sure you discuss any questions you have with your health care provider. Document Released: 07/01/2015 Document Revised: 12/26/2015 Document Reviewed: 02/15/2015 Elsevier Interactive Patient Education  2018 [employer]   Tramadol tablets What is this medicine? TRAMADOL (TRA ma dole) is a pain reliever. It is used to treat moderate to severe pain in adults. This medicine may be used for other purposes; ask your health care provider or pharmacist if you have questions. COMMON BRAND NAME(S): Ultram What should I tell my health care provider before I take this medicine? They need to know if you have any of these conditions: -brain tumor -depression -drug abuse or addiction -head injury -if you frequently drink alcohol containing drinks -kidney disease or trouble passing urine -liver disease -lung disease, asthma, or breathing problems -seizures or epilepsy -suicidal thoughts, plans, or attempt; a previous suicide attempt by you or a family member -an unusual or allergic reaction to tramadol, codeine, other medicines, foods, dyes, or preservatives -pregnant or trying to get pregnant -breast-feeding How should I use this medicine? Take this medicine by mouth with a full glass of water. Follow the directions on the prescription label. You can take it with or without food. If it upsets your stomach, take it with food. Do not take your medicine more often than directed. A special MedGuide will be given to you by the pharmacist with each prescription and refill. Be sure to read this information carefully each time. Talk to your pediatrician regarding the use of this medicine in children. Special care may be needed. Overdosage: If you think you have taken too much of this medicine contact a poison control center  or emergency room at once. NOTE: This medicine is only for you. Do not share this medicine with others. What if I miss a dose? If you miss a dose, take it as soon as you can. If it is almost time for your next dose, take only that dose. Do not take double or extra doses. What may interact with this medicine? Do not take this medication with any of the following medicines: -MAOIs like Carbex, Eldepryl, Marplan, Nardil, and Parnate This medicine may also interact with the following medications: -alcohol -antihistamines for allergy, cough and cold -certain medicines for anxiety or sleep -certain medicines for depression like amitriptyline, fluoxetine, sertraline -certain medicines for migraine headache like almotriptan, eletriptan, frovatriptan, naratriptan, rizatriptan, sumatriptan, zolmitriptan -certain medicines for seizures like carbamazepine, oxcarbazepine, phenobarbital, primidone -certain medicines that treat or prevent blood clots like warfarin -digoxin -furazolidone -general anesthetics like halothane, isoflurane, methoxyflurane, propofol -linezolid -local anesthetics like lidocaine, pramoxine, tetracaine -medicines that relax muscles for surgery -other narcotic medicines for pain or cough -phenothiazines like chlorpromazine, mesoridazine, prochlorperazine, thioridazine -procarbazine This list may not describe all possible interactions. Give your health care provider a list of all the medicines, herbs, non-prescription  drugs, or dietary supplements you use. Also tell them if you smoke, drink alcohol, or use illegal drugs. Some items may interact with your medicine. What should I watch for while using this medicine? Tell your doctor or health care professional if your pain does not go away, if it gets worse, or if you have new or a different type of pain. You may develop tolerance to the medicine. Tolerance means that you will need a higher dose of the medicine for pain relief.  Tolerance is normal and is expected if you take this medicine for a long time. Do not suddenly stop taking your medicine because you may develop a severe reaction. Your body becomes used to the medicine. This does NOT mean you are addicted. Addiction is a behavior related to getting and using a drug for a non-medical reason. If you have pain, you have a medical reason to take pain medicine. Your doctor will tell you how much medicine to take. If your doctor wants you to stop the medicine, the dose will be slowly lowered over time to avoid any side effects. There are different types of narcotic medicines (opiates). If you take more than one type at the same time or if you are taking another medicine that also causes drowsiness, you may have more side effects. Give your health care provider a list of all medicines you use. Your doctor will tell you how much medicine to take. Do not take more medicine than directed. Call emergency for help if you have problems breathing or unusual sleepiness. You may get drowsy or dizzy. Do not drive, use machinery, or do anything that needs mental alertness until you know how this medicine affects you. Do not stand or sit up quickly, especially if you are an older patient. This reduces the risk of dizzy or fainting spells. Alcohol can increase or decrease the effects of this medicine. Avoid alcoholic drinks. You may have constipation. Try to have a bowel movement at least every 2 to 3 days. If you do not have a bowel movement for 3 days, call your doctor or health care professional. Your mouth may get dry. Chewing sugarless gum or sucking hard candy, and drinking plenty of water may help. Contact your doctor if the problem does not go away or is severe. What side effects may I notice from receiving this medicine? Side effects that you should report to your doctor or health care professional as soon as possible: -allergic reactions like skin rash, itching or hives, swelling of  the face, lips, or tongue -breathing problems -confusion -seizures -signs and symptoms of low blood pressure like dizziness; feeling faint or lightheaded, falls; unusually weak or tired -trouble passing urine or change in the amount of urine Side effects that usually do not require medical attention (report to your doctor or health care professional if they continue or are bothersome): -constipation -dry mouth -nausea, vomiting -tiredness This list may not describe all possible side effects. Call your doctor for medical advice about side effects. You may report side effects to FDA at 1-800-FDA-1088. Where should I keep my medicine? Keep out of the reach of children. This medicine may cause accidental overdose and death if it taken by other adults, children, or pets. Mix any unused medicine with a substance like cat litter or coffee grounds. Then throw the medicine away in a sealed container like a sealed bag or a coffee can with a lid. Do not use the medicine after the expiration date. Store at  room temperature between 15 and 30 degrees C (59 and 86 degrees F). NOTE: This sheet is a summary. It may not cover all possible information. If you have questions about this medicine, talk to your doctor, pharmacist, or health care provider.  2018 Elsevier/Gold Standard (2015-04-14 09:00:04)

## 2018-04-20 NOTE — Progress Notes (Signed)
Subjective:   Patient ID: Justin RiversBruce Mcbride, male   DOB: 62 y.o.   MRN: [id number]   HPI 62 year old male presents the office today for concerns of extreme burning, numbness to both of his feet which is been ongoing for about 4 to 5 years.  He states that he has been told he has neuropathy.  He does not have insurance previously been seen the cardiologist and he states that he had done some blood work as well as the MRI of his back.  Unable to identify his wife is getting numbness to his feet.  He states that he has to take his wife's tramadol and this is been helpful.  He did try gabapentin he thinks he took 300 mg twice a day and this was not helpful for him previously.   Review of Systems  All other systems reviewed and are negative.  Past Medical History:  Diagnosis Date  . Allergy   . Chest pain   . Neuropathy   . SOB (shortness of breath)   . Varicose veins     Past Surgical History:  Procedure Laterality Date  . HERNIA REPAIR    . HERNIA REPAIR    . TONSILLECTOMY       Current Outpatient Medications:  .  diclofenac (VOLTAREN) 75 MG EC tablet, diclofenac sodium 75 mg tablet,delayed release  Take 1 tablet twice a day by oral route for 30 days., Disp: , Rfl:  .  docusate sodium (COLACE) 100 MG capsule, Take 1 capsule (100 mg total) by mouth daily. If you tend to get constipation and are taking a narcotic medication such as Vicodin, take a Colace daily to prevent constipation., Disp: 60 capsule, Rfl: 0 .  esomeprazole (NEXIUM) 40 MG capsule, Take 1 capsule (40 mg total) by mouth daily., Disp: 30 capsule, Rfl: 0 .  gabapentin (NEURONTIN) 300 MG capsule, Take 300 mg by mouth 2 (two) times daily., Disp: , Rfl: 1 .  HYDROcodone-acetaminophen (NORCO/VICODIN) 5-325 MG tablet, Take 1-2 tablets by mouth every 4 (four) hours as needed for moderate pain or severe pain., Disp: 20 tablet, Rfl: 0 .  meclizine (ANTIVERT) 25 MG tablet, Take 1 tablet (25 mg total) by mouth 3 (three) times daily as  needed for dizziness. (Patient not taking: Reported on 06/12/2015), Disp: 30 tablet, Rfl: 0 .  sucralfate (CARAFATE) 1 GM/10ML suspension, Take 10 mLs (1 g total) by mouth 4 (four) times daily -  with meals and at bedtime., Disp: 420 mL, Rfl: 0 .  tiZANidine (ZANAFLEX) 4 MG tablet, Take 4 mg by mouth at bedtime., Disp: , Rfl: 1 .  traMADol (ULTRAM) 50 MG tablet, Take 1 tablet (50 mg total) by mouth every 12 (twelve) hours as needed., Disp: 10 tablet, Rfl: 0  No Known Allergies      Objective:  Physical Exam  General: AAO x3, NAD  Dermatological: Skin is warm, dry and supple bilateral. There are no open sores, no preulcerative lesions, no rash or signs of infection present.  Vascular: Dorsalis Pedis artery and Posterior Tibial artery pedal pulses are 2/4 bilateral with immedate capillary fill time.  There is no pain with calf compression, swelling, warmth, erythema.  Denies any claudication symptoms.  Neruologic: Altered vibratory sensation, overall sensation with Semmes Weinstein monofilament is mildly decreased to the forefoot.  Musculoskeletal: There is no area tenderness or pinpoint tenderness bilaterally.  There is no edema, erythema, increase in warmth.  Hammertoe contractures are present.  Muscular strength 5/5 in all groups tested bilateral.  Gait: Unassisted, Nonantalgic.       Assessment:   Neuropathy     Plan:  -Treatment options discussed including all alternatives, risks, and complications -Etiology of symptoms were discussed -Due to the symptoms are consistent with neuropathy.  Will order nerve conduction test.  I am going to try to get the results of the blood work.  Also discussed possible neurology evaluation.  She is in agreement to this.  For now I did prescribe a one-time dose of tramadol.  Vivi Barrack DPM

## 2019-11-11 ENCOUNTER — Ambulatory Visit: Payer: Self-pay | Attending: Internal Medicine

## 2019-11-11 DIAGNOSIS — Z23 Encounter for immunization: Secondary | ICD-10-CM

## 2019-11-11 NOTE — Progress Notes (Signed)
   Covid-19 Vaccination Clinic  Name:  Justin Mcbride    MRN: [id number] DOB: 08/07/1955  11/11/2019  [name] was observed post Covid-19 immunization for 15 minutes without incident. He was provided with Vaccine Information Sheet and instruction to access the V-Safe system.   [name] was instructed to call 911 with any severe reactions post vaccine: Marland Kitchen Difficulty breathing  . Swelling of face and throat  . A fast heartbeat  . A bad rash all over body  . Dizziness and weakness   Immunizations Administered    Name Date Dose VIS Date Route   Pfizer COVID-19 Vaccine 11/11/2019  3:13 PM 0.3 mL 07/14/2019 Intramuscular   Manufacturer: [employer], [employer]   Lot: [id number]   NDC: [address]-7

## 2019-11-27 ENCOUNTER — Ambulatory Visit
Admission: RE | Admit: 2019-11-27 | Discharge: 2019-11-27 | Disposition: A | Discharge status: Home / Self Care | Payer: Self-pay | Source: Ambulatory Visit | Attending: Cardiovascular Disease | Admitting: Cardiovascular Disease

## 2019-11-27 ENCOUNTER — Other Ambulatory Visit: Payer: Self-pay | Admitting: Cardiovascular Disease

## 2019-11-27 DIAGNOSIS — R05 Cough: Secondary | ICD-10-CM

## 2019-11-27 DIAGNOSIS — R053 Chronic cough: Secondary | ICD-10-CM

## 2019-12-05 ENCOUNTER — Ambulatory Visit: Payer: Self-pay | Attending: Internal Medicine

## 2019-12-05 DIAGNOSIS — Z23 Encounter for immunization: Secondary | ICD-10-CM

## 2019-12-05 NOTE — Progress Notes (Signed)
   Covid-19 Vaccination Clinic  Name:  Meshach Perry    MRN: [id number] DOB: [birth year]  12/05/2019  Mr. Desouza was observed post Covid-19 immunization for 15 minutes without incident. He was provided with Vaccine Information Sheet and instruction to access the V-Safe system.   Mr. Hallisey was instructed to call 911 with any severe reactions post vaccine: Marland Kitchen Difficulty breathing  . Swelling of face and throat  . A fast heartbeat  . A bad rash all over body  . Dizziness and weakness   Immunizations Administered    Name Date Dose VIS Date Route   Pfizer COVID-19 Vaccine 12/05/2019  8:48 AM 0.3 mL 09/27/2018 Intramuscular   Manufacturer: [employer], [employer]   Lot: [id number]   NDC: [address]-2

## 2021-02-25 ENCOUNTER — Ambulatory Visit
Admission: RE | Admit: 2021-02-25 | Discharge: 2021-02-25 | Disposition: A | Discharge status: Home / Self Care | Payer: No Typology Code available for payment source | Source: Ambulatory Visit | Attending: Cardiovascular Disease | Admitting: Cardiovascular Disease

## 2021-02-25 ENCOUNTER — Other Ambulatory Visit: Payer: Self-pay

## 2021-02-25 ENCOUNTER — Other Ambulatory Visit: Payer: Self-pay | Admitting: Cardiovascular Disease

## 2021-02-25 DIAGNOSIS — R0602 Shortness of breath: Secondary | ICD-10-CM

## 2021-06-26 IMAGING — CR DG CHEST 2V
2 series · 2 of 2 positions shown · non-contrast
Comparison: 02/01/2018

CLINICAL DATA: Cough, shortness of breath, wheezing

EXAM:
CHEST - 2 VIEW

[w chest pa]
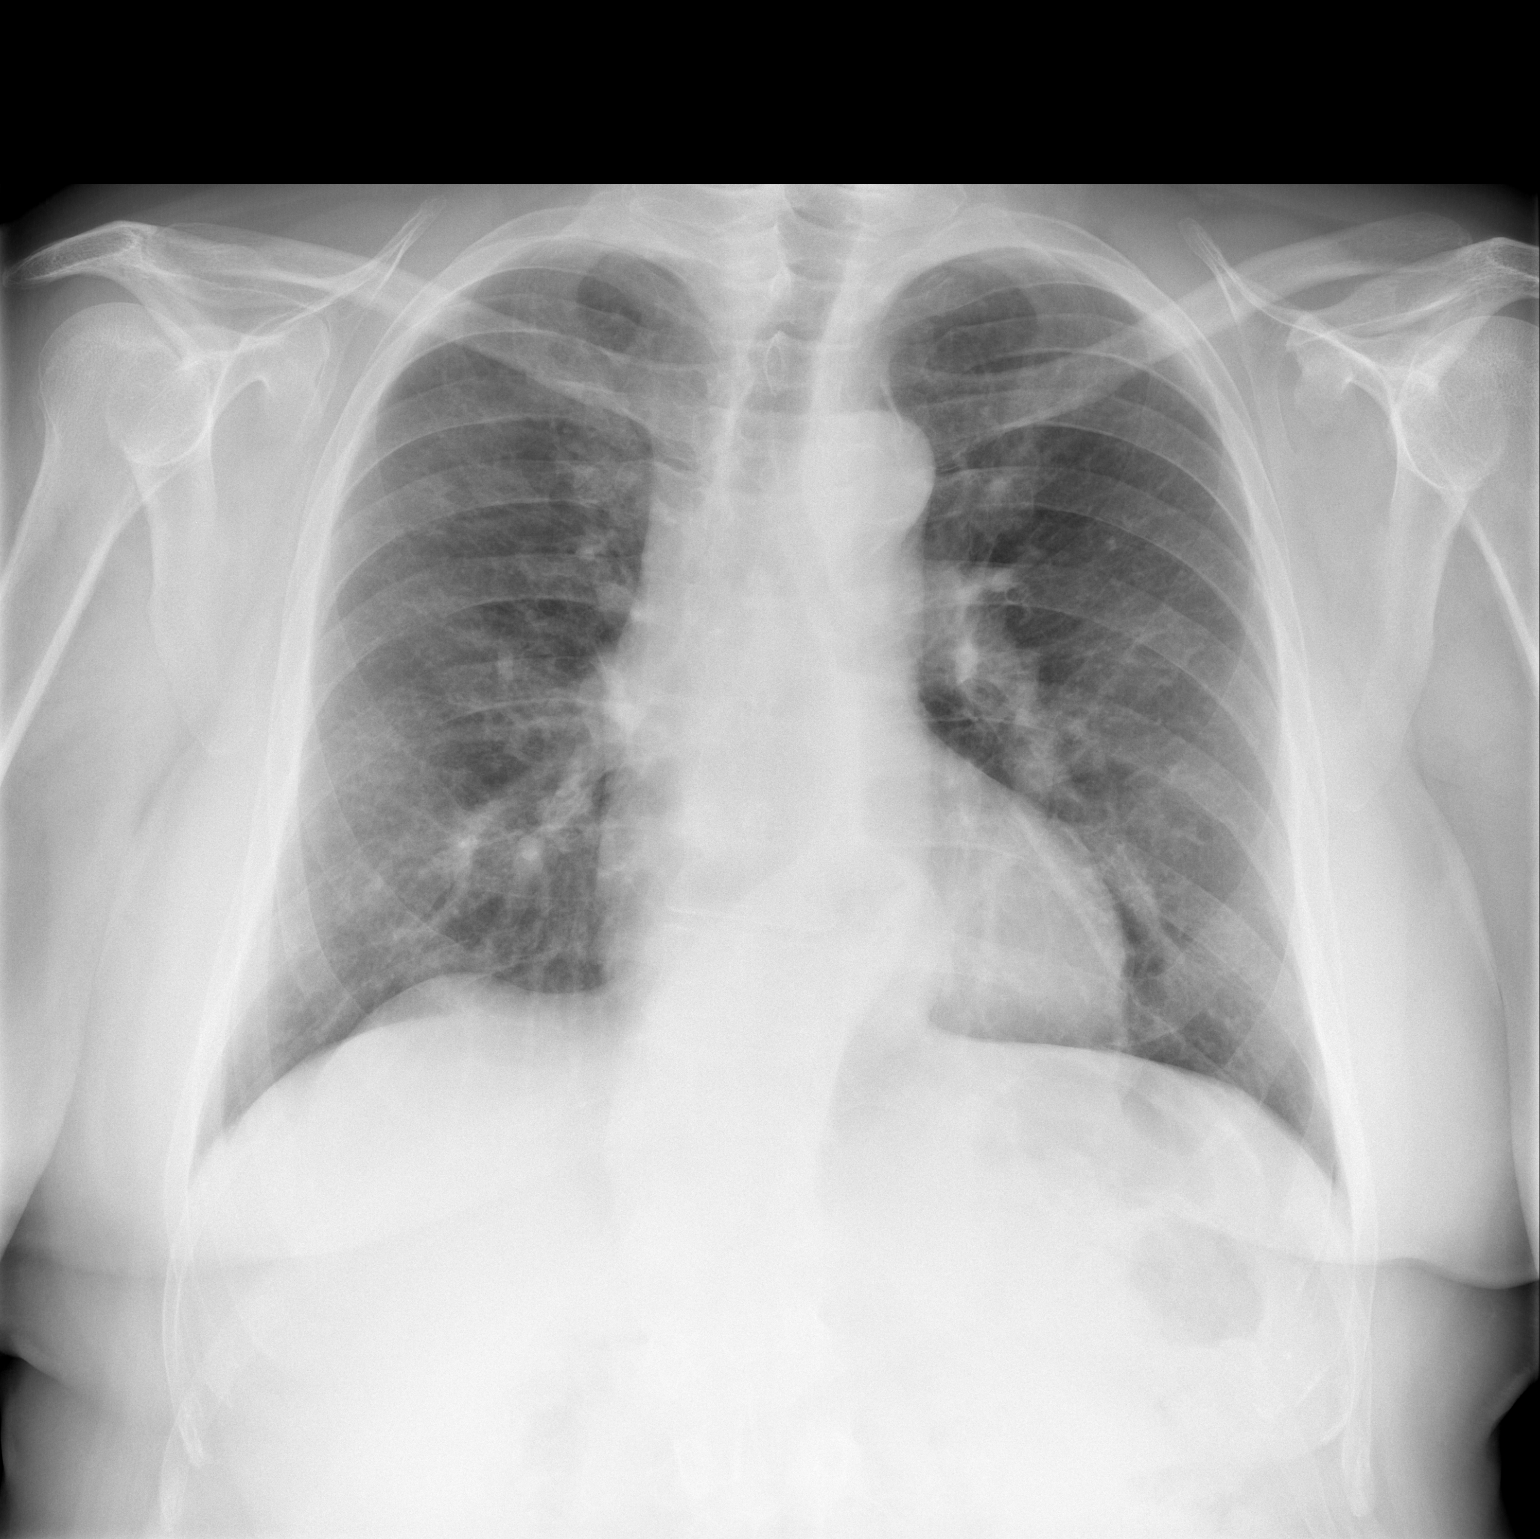

[w chest lat]
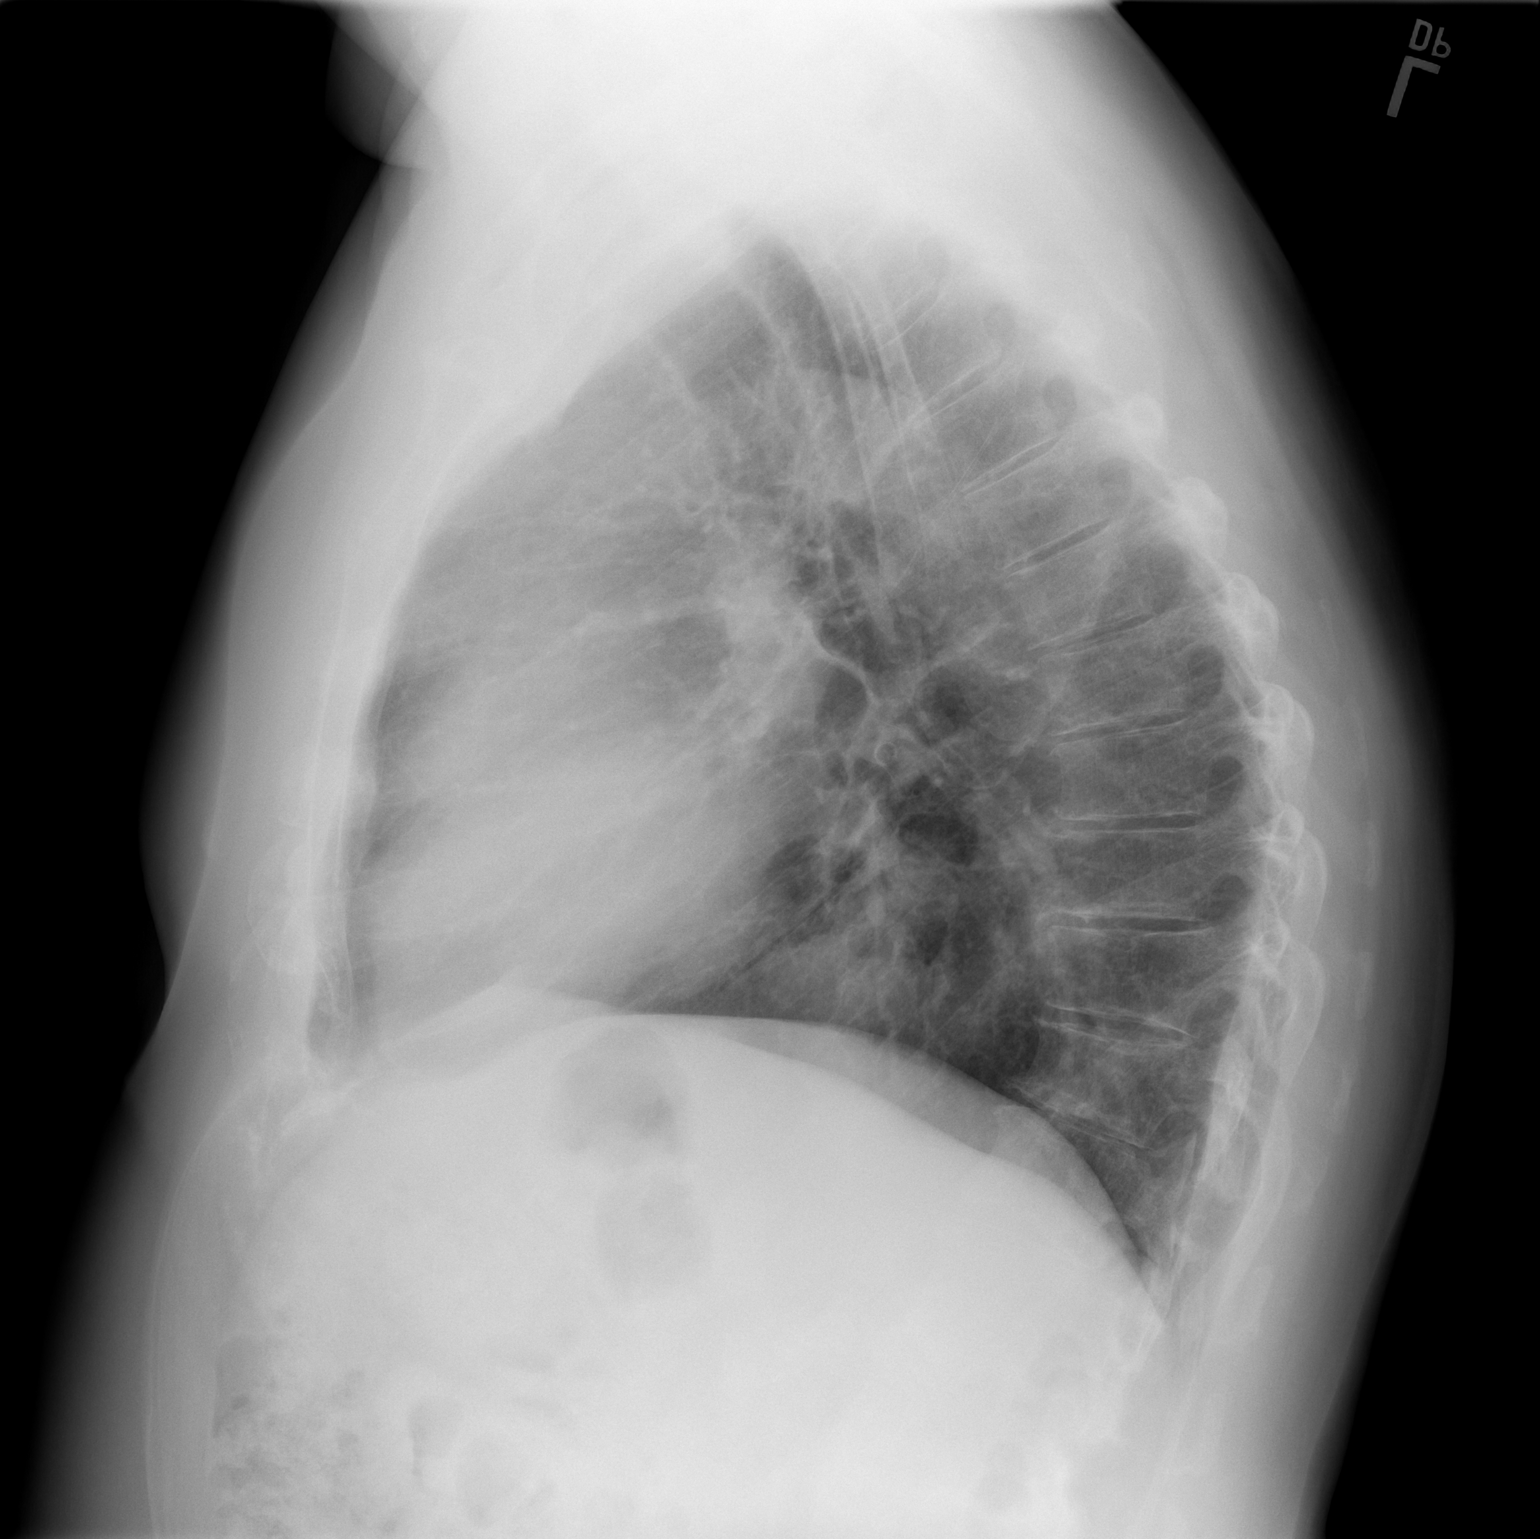

[2 of 2 positions shown; findings below may reference images not displayed]

FINDINGS: Moderate-sized hiatal hernia. Heart and mediastinal contours are
within normal limits. No focal opacities or effusions. No acute bony
abnormality.
IMPRESSION: Hiatal hernia.  No active cardiopulmonary disease.

## 2022-09-07 ENCOUNTER — Other Ambulatory Visit: Payer: Self-pay | Admitting: Family Medicine

## 2022-09-07 ENCOUNTER — Ambulatory Visit
Admission: RE | Admit: 2022-09-07 | Discharge: 2022-09-07 | Disposition: A | Discharge status: Home / Self Care | Payer: Medicare Other | Source: Ambulatory Visit | Attending: Family Medicine | Admitting: Family Medicine

## 2022-09-07 DIAGNOSIS — M5441 Lumbago with sciatica, right side: Secondary | ICD-10-CM

## 2022-09-07 DIAGNOSIS — M5432 Sciatica, left side: Secondary | ICD-10-CM

## 2022-12-02 ENCOUNTER — Encounter: Payer: Self-pay | Admitting: Vascular Surgery

## 2022-12-02 ENCOUNTER — Ambulatory Visit: Payer: Medicare Other | Admitting: Vascular Surgery

## 2022-12-02 VITALS — BP 135/95 | HR 93 | Temp 98.0°F | Resp 16 | Ht 73.0 in | Wt 226.0 lb

## 2022-12-02 DIAGNOSIS — I83813 Varicose veins of bilateral lower extremities with pain: Secondary | ICD-10-CM

## 2022-12-02 DIAGNOSIS — I872 Venous insufficiency (chronic) (peripheral): Secondary | ICD-10-CM

## 2022-12-02 NOTE — Progress Notes (Signed)
ASSESSMENT & PLAN   CHRONIC VENOUS INSUFFICIENCY: Based on my assessment, the patient has significantly large varicose veins of both lower extremities but these were not causing significant symptoms.  I do not think his venous disease explains his burning sensation in his feet.  He does have some reflux in the right anterior accessory saphenous vein and also a segment of the left proximal great saphenous vein has recanalized.  We have discussed the importance of intermittent leg elevation and the proper positioning for this.  I have encouraged him to avoid prolonged sitting and standing.  We discussed the importance of exercise specifically walking and water aerobics.  I encouraged him to wear compression stockings.  If his symptoms progress he would need formal venous reflux testing.  He will call if his symptoms progress.  REASON FOR CONSULT:    Burning pain in his feet  HPI:   Justin Mcbride is a 67 y.o. male who was undergone previous laser ablation of both great saphenous veins and multiple stab phlebectomies back in 2015 by Dr. Hart Rochester.  He was then lost to follow-up.  He presents today with a chief complaint of burning pain in his feet.  He has been diagnosed with neuropathy.  He is on gabapentin.  He has seen a back surgeon who did not feel that his symptoms could be attributed to his back.  He does not have diabetes.  He does not describe any significant aching pain heaviness or swelling in his legs.  He does not wear compression stockings.  He does not elevate his legs routinely.  He is fairly active.  He has no previous history of DVT.  Past Medical History:  Diagnosis Date   Allergy    Chest pain    Neuropathy    SOB (shortness of breath)    Varicose veins     Family History  Problem Relation Age of Onset   Cancer Father        lung    SOCIAL HISTORY: Social History   Tobacco Use   Smoking status: Never   Smokeless tobacco: Never  Substance Use Topics   Alcohol use:  No    No Known Allergies  Current Outpatient Medications  Medication Sig Dispense Refill   diclofenac (VOLTAREN) 75 MG EC tablet diclofenac sodium 75 mg tablet,delayed release  Take 1 tablet twice a day by oral route for 30 days.     docusate sodium (COLACE) 100 MG capsule Take 1 capsule (100 mg total) by mouth daily. If you tend to get constipation and are taking a narcotic medication such as Vicodin, take a Colace daily to prevent constipation. 60 capsule 0   esomeprazole (NEXIUM) 40 MG capsule Take 1 capsule (40 mg total) by mouth daily. 30 capsule 0   gabapentin (NEURONTIN) 300 MG capsule Take 300 mg by mouth 2 (two) times daily.  1   HYDROcodone-acetaminophen (NORCO/VICODIN) 5-325 MG tablet Take 1-2 tablets by mouth every 4 (four) hours as needed for moderate pain or severe pain. 20 tablet 0   sucralfate (CARAFATE) 1 GM/10ML suspension Take 10 mLs (1 g total) by mouth 4 (four) times daily -  with meals and at bedtime. 420 mL 0   tiZANidine (ZANAFLEX) 4 MG tablet Take 4 mg by mouth at bedtime.  1   traMADol (ULTRAM) 50 MG tablet Take 1 tablet (50 mg total) by mouth every 12 (twelve) hours as needed. 10 tablet 0   meclizine (ANTIVERT) 25 MG tablet Take 1 tablet (  25 mg total) by mouth 3 (three) times daily as needed for dizziness. (Patient not taking: Reported on 06/12/2015) 30 tablet 0   No current facility-administered medications for this visit.    REVIEW OF SYSTEMS:  [X]  denotes positive finding, [ ]  denotes negative finding Cardiac  Comments:  Chest pain or chest pressure:    Shortness of breath upon exertion:    Short of breath when lying flat:    Irregular heart rhythm:        Vascular    Pain in calf, thigh, or hip brought on by ambulation:    Pain in feet at night that wakes you up from your sleep:     Blood clot in your veins:    Leg swelling:         Pulmonary    Oxygen at home:    Productive cough:     Wheezing:         Neurologic    Sudden weakness in arms or  legs:     Sudden numbness in arms or legs:     Sudden onset of difficulty speaking or slurred speech:    Temporary loss of vision in one eye:     Problems with dizziness:         Gastrointestinal    Blood in stool:     Vomited blood:         Genitourinary    Burning when urinating:     Blood in urine:        Psychiatric    Major depression:         Hematologic    Bleeding problems:    Problems with blood clotting too easily:        Skin    Rashes or ulcers:        Constitutional    Fever or chills:    -  PHYSICAL EXAM:   Vitals:   12/02/22 0848  BP: (!) 135/95  Pulse: 93  Resp: 16  Temp: 98 F (36.7 C)  TempSrc: Temporal  SpO2: 99%  Weight: 226 lb (102.5 kg)  Height: 6\' 1"  (1.854 m)   Body mass index is 29.82 kg/m. GENERAL: The patient is a well-nourished male, in no acute distress. The vital signs are documented above. CARDIAC: There is a regular rate and rhythm.  VASCULAR: I do not detect carotid bruits. He has biphasic Doppler signals in both feet. He has markedly enlarged varicose veins of both lower extremities.  He also has a large varicosities in his thighs.       I did look at his superficial veins myself with the [employer].  On the right he has an anterior accessory saphenous vein which is feeding these varicosities in his thigh. On the left the proximal left great saphenous vein has recanalized. PULMONARY: There is good air exchange bilaterally without wheezing or rales. ABDOMEN: Soft and non-tender with normal pitched bowel sounds.  MUSCULOSKELETAL: There are no major deformities. NEUROLOGIC: No focal weakness or paresthesias are detected. SKIN: There are no ulcers or rashes noted. PSYCHIATRIC: The patient has a normal affect.  DATA:    He did not have formal venous reflux testing today.  Waverly Ferrari [facility]

## 2023-05-12 DIAGNOSIS — Z23 Encounter for immunization: Secondary | ICD-10-CM | POA: Diagnosis not present

## 2023-06-01 DIAGNOSIS — R7303 Prediabetes: Secondary | ICD-10-CM | POA: Diagnosis not present

## 2023-06-01 DIAGNOSIS — E785 Hyperlipidemia, unspecified: Secondary | ICD-10-CM | POA: Diagnosis not present

## 2023-06-01 DIAGNOSIS — R17 Unspecified jaundice: Secondary | ICD-10-CM | POA: Diagnosis not present

## 2023-06-07 DIAGNOSIS — M79671 Pain in right foot: Secondary | ICD-10-CM | POA: Diagnosis not present

## 2023-06-07 DIAGNOSIS — M545 Low back pain, unspecified: Secondary | ICD-10-CM | POA: Diagnosis not present

## 2023-06-07 DIAGNOSIS — M65341 Trigger finger, right ring finger: Secondary | ICD-10-CM | POA: Diagnosis not present

## 2023-06-07 DIAGNOSIS — R7303 Prediabetes: Secondary | ICD-10-CM | POA: Diagnosis not present

## 2023-06-07 DIAGNOSIS — G8929 Other chronic pain: Secondary | ICD-10-CM | POA: Diagnosis not present

## 2023-06-07 DIAGNOSIS — N3281 Overactive bladder: Secondary | ICD-10-CM | POA: Diagnosis not present

## 2023-06-07 DIAGNOSIS — R600 Localized edema: Secondary | ICD-10-CM | POA: Diagnosis not present

## 2023-06-07 DIAGNOSIS — M79672 Pain in left foot: Secondary | ICD-10-CM | POA: Diagnosis not present

## 2023-06-07 DIAGNOSIS — M40204 Unspecified kyphosis, thoracic region: Secondary | ICD-10-CM | POA: Diagnosis not present

## 2023-06-07 DIAGNOSIS — I83893 Varicose veins of bilateral lower extremities with other complications: Secondary | ICD-10-CM | POA: Diagnosis not present

## 2023-06-07 DIAGNOSIS — E785 Hyperlipidemia, unspecified: Secondary | ICD-10-CM | POA: Diagnosis not present

## 2023-06-21 ENCOUNTER — Ambulatory Visit (INDEPENDENT_AMBULATORY_CARE_PROVIDER_SITE_OTHER): Payer: 59 | Admitting: Podiatry

## 2023-06-21 ENCOUNTER — Ambulatory Visit (INDEPENDENT_AMBULATORY_CARE_PROVIDER_SITE_OTHER): Payer: 59

## 2023-06-21 ENCOUNTER — Encounter: Payer: Self-pay | Admitting: Podiatry

## 2023-06-21 VITALS — Ht 73.0 in | Wt 226.0 lb

## 2023-06-21 DIAGNOSIS — M778 Other enthesopathies, not elsewhere classified: Secondary | ICD-10-CM

## 2023-06-21 DIAGNOSIS — G629 Polyneuropathy, unspecified: Secondary | ICD-10-CM | POA: Diagnosis not present

## 2023-06-21 NOTE — Progress Notes (Signed)
   Chief Complaint  Patient presents with   Foot Pain    Patient is here for bilateral foot pain for several years some neuropathy from center foot to toes, can't be ob feet for more than 3 hours, pain feels very tender    HPI: 67 y.o. male presenting today for evaluation of numbness to the bilateral feet with pins and needle sensation that has been ongoing for about 8+ years now.  History of venous insufficiency with venous ablation as well as lumbar radiculopathy.  Past Medical History:  Diagnosis Date   Allergy    Chest pain    Neuropathy    SOB (shortness of breath)    Varicose veins     Past Surgical History:  Procedure Laterality Date   HERNIA REPAIR     HERNIA REPAIR     TONSILLECTOMY      No Known Allergies   Physical Exam: General: The patient is alert and oriented x3 in no acute distress.  Dermatology: Skin is warm, dry and supple bilateral lower extremities.   Vascular: Palpable pedal pulses bilaterally.  Varicosity is noted to the bilateral feet.  No appreciable edema.  Neurological: Light touch and protective threshold absent bilateral forefoot  Musculoskeletal Exam: No pedal deformities noted  Radiographic Exam B/L feet 06/21/2023:  Normal osseous mineralization. Joint spaces preserved.  No fractures or osseous irregularities noted.  Impression: Negative  Assessment/Plan of Care: 1.  Idiopathic peripheral polyneuropathy bilateral  -Patient evaluated.  X-rays reviewed -Unfortunately the patient has tried multiple modalities with no improvement.  I do believe he has permanent neuropathy. -He currently takes gabapentin nightly.  Continue as prescribed -Recommend good supportive shoes and sneakers -Return to clinic as needed       Felecia Shelling, DPM [facility]  Dr. Felecia Shelling, DPM    [address] [place], [place] [address]                Office [phone]  Fax [phone]

## 2023-07-02 DIAGNOSIS — M79671 Pain in right foot: Secondary | ICD-10-CM | POA: Diagnosis not present

## 2023-07-02 DIAGNOSIS — I83893 Varicose veins of bilateral lower extremities with other complications: Secondary | ICD-10-CM | POA: Diagnosis not present

## 2023-07-02 DIAGNOSIS — N3281 Overactive bladder: Secondary | ICD-10-CM | POA: Diagnosis not present

## 2023-07-02 DIAGNOSIS — Z5181 Encounter for therapeutic drug level monitoring: Secondary | ICD-10-CM | POA: Diagnosis not present

## 2023-07-02 DIAGNOSIS — G8929 Other chronic pain: Secondary | ICD-10-CM | POA: Diagnosis not present

## 2023-07-02 DIAGNOSIS — M545 Low back pain, unspecified: Secondary | ICD-10-CM | POA: Diagnosis not present

## 2023-07-02 DIAGNOSIS — G894 Chronic pain syndrome: Secondary | ICD-10-CM | POA: Diagnosis not present

## 2023-07-02 DIAGNOSIS — R600 Localized edema: Secondary | ICD-10-CM | POA: Diagnosis not present

## 2023-07-02 DIAGNOSIS — M79672 Pain in left foot: Secondary | ICD-10-CM | POA: Diagnosis not present

## 2023-08-16 DIAGNOSIS — M545 Low back pain, unspecified: Secondary | ICD-10-CM | POA: Diagnosis not present

## 2023-08-16 DIAGNOSIS — I83893 Varicose veins of bilateral lower extremities with other complications: Secondary | ICD-10-CM | POA: Diagnosis not present

## 2023-08-16 DIAGNOSIS — G8929 Other chronic pain: Secondary | ICD-10-CM | POA: Diagnosis not present

## 2023-08-16 DIAGNOSIS — M79671 Pain in right foot: Secondary | ICD-10-CM | POA: Diagnosis not present

## 2023-08-16 DIAGNOSIS — M79672 Pain in left foot: Secondary | ICD-10-CM | POA: Diagnosis not present

## 2023-10-27 DIAGNOSIS — M79671 Pain in right foot: Secondary | ICD-10-CM | POA: Diagnosis not present

## 2023-10-27 DIAGNOSIS — N3281 Overactive bladder: Secondary | ICD-10-CM | POA: Diagnosis not present

## 2023-10-27 DIAGNOSIS — G8929 Other chronic pain: Secondary | ICD-10-CM | POA: Diagnosis not present

## 2023-10-27 DIAGNOSIS — R7303 Prediabetes: Secondary | ICD-10-CM | POA: Diagnosis not present

## 2023-10-27 DIAGNOSIS — E785 Hyperlipidemia, unspecified: Secondary | ICD-10-CM | POA: Diagnosis not present

## 2023-10-27 DIAGNOSIS — I83893 Varicose veins of bilateral lower extremities with other complications: Secondary | ICD-10-CM | POA: Diagnosis not present

## 2023-10-27 DIAGNOSIS — R600 Localized edema: Secondary | ICD-10-CM | POA: Diagnosis not present

## 2023-10-27 DIAGNOSIS — M79672 Pain in left foot: Secondary | ICD-10-CM | POA: Diagnosis not present

## 2023-10-27 DIAGNOSIS — Z0001 Encounter for general adult medical examination with abnormal findings: Secondary | ICD-10-CM | POA: Diagnosis not present

## 2023-10-27 DIAGNOSIS — M545 Low back pain, unspecified: Secondary | ICD-10-CM | POA: Diagnosis not present

## 2024-01-03 DIAGNOSIS — K5730 Diverticulosis of large intestine without perforation or abscess without bleeding: Secondary | ICD-10-CM | POA: Diagnosis not present

## 2024-01-03 DIAGNOSIS — Z1211 Encounter for screening for malignant neoplasm of colon: Secondary | ICD-10-CM | POA: Diagnosis not present

## 2024-01-03 DIAGNOSIS — Z800 Family history of malignant neoplasm of digestive organs: Secondary | ICD-10-CM | POA: Diagnosis not present

## 2024-02-08 DIAGNOSIS — R7303 Prediabetes: Secondary | ICD-10-CM | POA: Diagnosis not present

## 2024-02-08 DIAGNOSIS — E785 Hyperlipidemia, unspecified: Secondary | ICD-10-CM | POA: Diagnosis not present

## 2024-02-08 DIAGNOSIS — Z0001 Encounter for general adult medical examination with abnormal findings: Secondary | ICD-10-CM | POA: Diagnosis not present

## 2024-02-16 DIAGNOSIS — E785 Hyperlipidemia, unspecified: Secondary | ICD-10-CM | POA: Diagnosis not present

## 2024-02-16 DIAGNOSIS — Z23 Encounter for immunization: Secondary | ICD-10-CM | POA: Diagnosis not present

## 2024-02-16 DIAGNOSIS — I4891 Unspecified atrial fibrillation: Secondary | ICD-10-CM | POA: Diagnosis not present

## 2024-02-16 DIAGNOSIS — M65341 Trigger finger, right ring finger: Secondary | ICD-10-CM | POA: Diagnosis not present

## 2024-02-16 DIAGNOSIS — R7303 Prediabetes: Secondary | ICD-10-CM | POA: Diagnosis not present

## 2024-02-16 DIAGNOSIS — E559 Vitamin D deficiency, unspecified: Secondary | ICD-10-CM | POA: Diagnosis not present

## 2024-02-16 DIAGNOSIS — I83893 Varicose veins of bilateral lower extremities with other complications: Secondary | ICD-10-CM | POA: Diagnosis not present

## 2024-02-16 DIAGNOSIS — Z136 Encounter for screening for cardiovascular disorders: Secondary | ICD-10-CM | POA: Diagnosis not present

## 2024-02-16 DIAGNOSIS — Z0001 Encounter for general adult medical examination with abnormal findings: Secondary | ICD-10-CM | POA: Diagnosis not present

## 2024-02-16 DIAGNOSIS — Z1211 Encounter for screening for malignant neoplasm of colon: Secondary | ICD-10-CM | POA: Diagnosis not present

## 2024-02-16 DIAGNOSIS — M545 Low back pain, unspecified: Secondary | ICD-10-CM | POA: Diagnosis not present

## 2024-02-17 DIAGNOSIS — I4891 Unspecified atrial fibrillation: Secondary | ICD-10-CM | POA: Diagnosis not present

## 2024-02-17 DIAGNOSIS — E559 Vitamin D deficiency, unspecified: Secondary | ICD-10-CM | POA: Diagnosis not present

## 2024-03-06 DIAGNOSIS — I10 Essential (primary) hypertension: Secondary | ICD-10-CM | POA: Diagnosis not present

## 2024-03-06 DIAGNOSIS — I4821 Permanent atrial fibrillation: Secondary | ICD-10-CM | POA: Diagnosis not present

## 2024-03-06 DIAGNOSIS — E7849 Other hyperlipidemia: Secondary | ICD-10-CM | POA: Diagnosis not present

## 2024-03-15 DIAGNOSIS — I440 Atrioventricular block, first degree: Secondary | ICD-10-CM | POA: Diagnosis not present

## 2024-03-20 DIAGNOSIS — I4891 Unspecified atrial fibrillation: Secondary | ICD-10-CM | POA: Diagnosis not present

## 2024-03-21 DIAGNOSIS — R42 Dizziness and giddiness: Secondary | ICD-10-CM | POA: Diagnosis not present

## 2024-03-21 DIAGNOSIS — I4819 Other persistent atrial fibrillation: Secondary | ICD-10-CM | POA: Diagnosis not present

## 2024-03-21 DIAGNOSIS — R5383 Other fatigue: Secondary | ICD-10-CM | POA: Diagnosis not present

## 2024-03-21 DIAGNOSIS — R0602 Shortness of breath: Secondary | ICD-10-CM | POA: Diagnosis not present

## 2024-03-23 ENCOUNTER — Other Ambulatory Visit (HOSPITAL_COMMUNITY): Payer: Self-pay | Admitting: Internal Medicine

## 2024-03-23 DIAGNOSIS — I4891 Unspecified atrial fibrillation: Secondary | ICD-10-CM

## 2024-03-27 DIAGNOSIS — I4821 Permanent atrial fibrillation: Secondary | ICD-10-CM | POA: Diagnosis not present

## 2024-03-29 ENCOUNTER — Telehealth (HOSPITAL_COMMUNITY): Payer: Self-pay | Admitting: Internal Medicine

## 2024-03-29 NOTE — Telephone Encounter (Signed)
 Just an FYI. We have made several attempts to contact this patient including sending a letter to schedule or reschedule their echocardiogram. We will be removing the patient from the echo WQ.    Order removed from WQ due to unable to contact pt. LBW  03/28/24 [facility] @ 8:15/LBW  03/27/24 [facility] x 2 @ 1:53/LBW  03/23/24 [facility] @ 10:21/LBW  NO PRIOR AUTH NEEDED (ONBASE)        Thank you

## 2024-04-10 ENCOUNTER — Ambulatory Visit
Admission: RE | Admit: 2024-04-10 | Discharge: 2024-04-10 | Disposition: A | Discharge status: Home / Self Care | Source: Ambulatory Visit | Attending: Internal Medicine | Admitting: Internal Medicine

## 2024-04-10 ENCOUNTER — Other Ambulatory Visit: Payer: Self-pay | Admitting: Internal Medicine

## 2024-04-10 DIAGNOSIS — R0602 Shortness of breath: Secondary | ICD-10-CM

## 2024-04-10 DIAGNOSIS — R000 Tachycardia, unspecified: Secondary | ICD-10-CM | POA: Diagnosis not present

## 2024-05-09 ENCOUNTER — Encounter (HOSPITAL_BASED_OUTPATIENT_CLINIC_OR_DEPARTMENT_OTHER): Payer: Self-pay | Admitting: Orthopedic Surgery

## 2024-05-09 ENCOUNTER — Other Ambulatory Visit: Payer: Self-pay

## 2024-05-16 NOTE — Anesthesia Preprocedure Evaluation (Signed)
 Anesthesia Evaluation  Patient identified by MRN, date of birth, ID band Patient awake    Reviewed: Allergy & Precautions, NPO status , Patient's Chart, lab work & pertinent test results  History of Anesthesia Complications Negative for: history of anesthetic complications  Airway Mallampati: II  TM Distance: >3 FB Neck ROM: Full    Dental  (+) Dental Advisory Given, Teeth Intact   Pulmonary neg pulmonary ROS   Pulmonary exam normal        Cardiovascular negative cardio ROS Normal cardiovascular exam     Neuro/Psych  Neuromuscular disease  negative psych ROS   GI/Hepatic Neg liver ROS,GERD  Medicated and Controlled,,  Endo/Other   Obesity   Renal/GU negative Renal ROS     Musculoskeletal negative musculoskeletal ROS (+)    Abdominal   Peds  Hematology negative hematology ROS (+)   Anesthesia Other Findings   Reproductive/Obstetrics                              Anesthesia Physical Anesthesia Plan  ASA: 2  Anesthesia Plan: MAC   Post-op Pain Management: Tylenol  PO (pre-op)* and Minimal or no pain anticipated   Induction:   PONV Risk Score and Plan: 1 and Propofol infusion and Treatment may vary due to age or medical condition  Airway Management Planned: Natural Airway and Simple Face Mask  Additional Equipment: None  Intra-op Plan:   Post-operative Plan:   Informed Consent: I have reviewed the patients History and Physical, chart, labs and discussed the procedure including the risks, benefits and alternatives for the proposed anesthesia with the patient or authorized representative who has indicated his/her understanding and acceptance.       Plan Discussed with: CRNA and Anesthesiologist  Anesthesia Plan Comments:          Anesthesia Quick Evaluation

## 2024-05-17 ENCOUNTER — Ambulatory Visit (HOSPITAL_BASED_OUTPATIENT_CLINIC_OR_DEPARTMENT_OTHER)
Admission: RE | Admit: 2024-05-17 | Discharge: 2024-05-17 | Disposition: A | Discharge status: Home / Self Care | Attending: Orthopedic Surgery | Admitting: Orthopedic Surgery

## 2024-05-17 ENCOUNTER — Other Ambulatory Visit: Payer: Self-pay

## 2024-05-17 ENCOUNTER — Ambulatory Visit (HOSPITAL_BASED_OUTPATIENT_CLINIC_OR_DEPARTMENT_OTHER): Admitting: Anesthesiology

## 2024-05-17 ENCOUNTER — Encounter (HOSPITAL_BASED_OUTPATIENT_CLINIC_OR_DEPARTMENT_OTHER): Admitting: Anesthesiology

## 2024-05-17 ENCOUNTER — Encounter (HOSPITAL_BASED_OUTPATIENT_CLINIC_OR_DEPARTMENT_OTHER)
Admission: RE | Disposition: A | Discharge status: Home / Self Care | Payer: Self-pay | Source: Home / Self Care | Attending: Orthopedic Surgery

## 2024-05-17 ENCOUNTER — Encounter (HOSPITAL_BASED_OUTPATIENT_CLINIC_OR_DEPARTMENT_OTHER): Payer: Self-pay | Admitting: Orthopedic Surgery

## 2024-05-17 DIAGNOSIS — Z79899 Other long term (current) drug therapy: Secondary | ICD-10-CM | POA: Insufficient documentation

## 2024-05-17 DIAGNOSIS — M65341 Trigger finger, right ring finger: Secondary | ICD-10-CM | POA: Diagnosis not present

## 2024-05-17 DIAGNOSIS — K219 Gastro-esophageal reflux disease without esophagitis: Secondary | ICD-10-CM | POA: Diagnosis not present

## 2024-05-17 DIAGNOSIS — Z01818 Encounter for other preprocedural examination: Secondary | ICD-10-CM

## 2024-05-17 HISTORY — PX: TRIGGER FINGER RELEASE: SHX641

## 2024-05-17 HISTORY — DX: Gastro-esophageal reflux disease without esophagitis: K21.9

## 2024-05-17 SURGERY — RELEASE, A1 PULLEY, FOR TRIGGER FINGER
Anesthesia: Monitor Anesthesia Care | Site: Ring Finger | Laterality: Right

## 2024-05-17 MED ORDER — CEFAZOLIN SODIUM-DEXTROSE 2-4 GM/100ML-% IV SOLN
2.0000 g | INTRAVENOUS | Status: AC
Start: 2024-05-17 — End: 2024-05-17
  Administered 2024-05-17: 2 g via INTRAVENOUS

## 2024-05-17 MED ORDER — FENTANYL CITRATE (PF) 100 MCG/2ML IJ SOLN: 25.0000 ug | INTRAMUSCULAR | Status: DC | PRN

## 2024-05-17 MED ORDER — CEFAZOLIN SODIUM-DEXTROSE 2-4 GM/100ML-% IV SOLN
INTRAVENOUS | Status: AC
  Filled 2024-05-17: qty 100

## 2024-05-17 MED ORDER — OXYCODONE HCL 5 MG PO TABS: 5.0000 mg | ORAL_TABLET | Freq: Four times a day (QID) | ORAL | 0 refills | Status: AC | PRN

## 2024-05-17 MED ORDER — MIDAZOLAM HCL 5 MG/5ML IJ SOLN
INTRAMUSCULAR | Status: DC | PRN
  Administered 2024-05-17: 1 mg via INTRAVENOUS

## 2024-05-17 MED ORDER — PROPOFOL 500 MG/50ML IV EMUL
INTRAVENOUS | Status: DC | PRN
  Administered 2024-05-17: 70 ug/kg/min via INTRAVENOUS

## 2024-05-17 MED ORDER — PROPOFOL 10 MG/ML IV BOLUS
INTRAVENOUS | Status: DC | PRN
  Administered 2024-05-17: 40 mg via INTRAVENOUS

## 2024-05-17 MED ORDER — ACETAMINOPHEN 500 MG PO TABS
1000.0000 mg | ORAL_TABLET | Freq: Once | ORAL | Status: AC
  Administered 2024-05-17: 1000 mg via ORAL

## 2024-05-17 MED ORDER — MIDAZOLAM HCL 2 MG/2ML IJ SOLN
INTRAMUSCULAR | Status: AC
Start: 2024-05-17 — End: 2024-05-17
  Filled 2024-05-17: qty 2

## 2024-05-17 MED ORDER — ONDANSETRON HCL 4 MG/2ML IJ SOLN
INTRAMUSCULAR | Status: AC
  Filled 2024-05-17: qty 2

## 2024-05-17 MED ORDER — FENTANYL CITRATE (PF) 100 MCG/2ML IJ SOLN
INTRAMUSCULAR | Status: AC
  Filled 2024-05-17: qty 2

## 2024-05-17 MED ORDER — LACTATED RINGERS IV SOLN: INTRAVENOUS | Status: DC

## 2024-05-17 MED ORDER — ACETAMINOPHEN 500 MG PO TABS
ORAL_TABLET | ORAL | Status: AC
  Filled 2024-05-17: qty 2

## 2024-05-17 MED ORDER — 0.9 % SODIUM CHLORIDE (POUR BTL) OPTIME
TOPICAL | Status: DC | PRN
  Administered 2024-05-17: 150 mL

## 2024-05-17 MED ORDER — ONDANSETRON HCL 4 MG/2ML IJ SOLN: 4.0000 mg | Freq: Once | INTRAMUSCULAR | Status: DC | PRN

## 2024-05-17 MED ORDER — BUPIVACAINE HCL (PF) 0.25 % IJ SOLN
INTRAMUSCULAR | Status: AC
Start: 2024-05-17 — End: 2024-05-17
  Filled 2024-05-17: qty 90

## 2024-05-17 MED ORDER — OXYCODONE HCL 5 MG PO TABS: 5.0000 mg | ORAL_TABLET | Freq: Once | ORAL | Status: DC | PRN

## 2024-05-17 MED ORDER — BUPIVACAINE HCL (PF) 0.25 % IJ SOLN
INTRAMUSCULAR | Status: DC | PRN
  Administered 2024-05-17: 10 mL

## 2024-05-17 MED ORDER — ONDANSETRON HCL 4 MG/2ML IJ SOLN
INTRAMUSCULAR | Status: DC | PRN
  Administered 2024-05-17: 4 mg via INTRAVENOUS

## 2024-05-17 MED ORDER — OXYCODONE HCL 5 MG/5ML PO SOLN: 5.0000 mg | Freq: Once | ORAL | Status: DC | PRN

## 2024-05-17 MED ORDER — FENTANYL CITRATE (PF) 100 MCG/2ML IJ SOLN
INTRAMUSCULAR | Status: DC | PRN
  Administered 2024-05-17: 25 ug via INTRAVENOUS

## 2024-05-17 SURGICAL SUPPLY — 29 items
BLADE SURG 15 STRL LF DISP TIS (BLADE) ×1 IMPLANT
BNDG COMPR ESMARK 4X3 LF (GAUZE/BANDAGES/DRESSINGS) ×1 IMPLANT
BNDG ELASTIC 2INX 5YD STR LF (GAUZE/BANDAGES/DRESSINGS) ×1 IMPLANT
CHLORAPREP W/TINT 26 (MISCELLANEOUS) ×1 IMPLANT
CORD BIPOLAR FORCEPS 12FT (ELECTRODE) ×1 IMPLANT
COVER BACK TABLE 60X90IN (DRAPES) ×1 IMPLANT
CUFF TOURN SGL QUICK 18X4 (TOURNIQUET CUFF) IMPLANT
CUFF TRNQT CYL 24X4X16.5-23 (TOURNIQUET CUFF) IMPLANT
DRAPE EXTREMITY T 121X128X90 (DISPOSABLE) ×1 IMPLANT
DRAPE SURG 17X23 STRL (DRAPES) ×1 IMPLANT
GAUZE SPONGE 4X4 12PLY STRL (GAUZE/BANDAGES/DRESSINGS) IMPLANT
GAUZE XEROFORM 1X8 LF (GAUZE/BANDAGES/DRESSINGS) ×1 IMPLANT
GLOVE BIO SURGEON STRL SZ7 (GLOVE) ×1 IMPLANT
GLOVE BIOGEL PI IND STRL 7.0 (GLOVE) ×1 IMPLANT
GLOVE BIOGEL PI IND STRL 7.5 (GLOVE) IMPLANT
GOWN STRL REUS W/ TWL LRG LVL3 (GOWN DISPOSABLE) ×2 IMPLANT
GOWN STRL REUS W/ TWL XL LVL3 (GOWN DISPOSABLE) IMPLANT
NDL HYPO 25X1 1.5 SAFETY (NEEDLE) ×1 IMPLANT
NEEDLE HYPO 25X1 1.5 SAFETY (NEEDLE) ×1 IMPLANT
NS IRRIG 1000ML POUR BTL (IV SOLUTION) ×1 IMPLANT
PACK BASIN DAY SURGERY FS (CUSTOM PROCEDURE TRAY) ×1 IMPLANT
SHEET MEDIUM DRAPE 40X70 STRL (DRAPES) ×1 IMPLANT
SUT ETHILON 4 0 PS 2 18 (SUTURE) ×1 IMPLANT
SUT VIC AB 4-0 PS2 18 (SUTURE) IMPLANT
SUT VICRYL RAPIDE 4/0 PS 2 (SUTURE) IMPLANT
SYR BULB EAR ULCER 3OZ GRN STR (SYRINGE) ×1 IMPLANT
SYR CONTROL 10ML LL (SYRINGE) ×1 IMPLANT
TOWEL GREEN STERILE FF (TOWEL DISPOSABLE) ×1 IMPLANT
UNDERPAD 30X36 HEAVY ABSORB (UNDERPADS AND DIAPERS) ×1 IMPLANT

## 2024-05-17 NOTE — Discharge Instructions (Addendum)
 Carlin Galla, M.D. Hand Surgery  POST-OPERATIVE DISCHARGE INSTRUCTIONS   PRESCRIPTIONS: - You may have been given a prescription to be taken as directed for post-operative pain control.  You may also take over the counter ibuprofen/aleve  and tylenol  for pain. Take this as directed on the packaging. Do not exceed 3000 mg tylenol /acetaminophen  in 24 hours.  Ibuprofen 600-800 mg (3-4) tablets by mouth every 6 hours as needed for pain.   OR  Aleve  2 tablets by mouth every 12 hours (twice daily) as needed for pain.   AND/OR  Tylenol  1000 mg (2 tablets) every 8 hours as needed for pain.  - Please use your pain medication carefully, as refills are limited and you may not be provided with one.  As stated above, please use over the counter pain medicine - it will also be helpful with decreasing your swelling.    ANESTHESIA: -After your surgery, post-surgical discomfort or pain is likely. This discomfort can last several days to a few weeks. At certain times of the day your discomfort may be more intense.   Did you receive a nerve block?   - A nerve block can provide pain relief for one hour to two days after your surgery. As long as the nerve block is working, you will experience little or no sensation in the area the surgeon operated on.  - As the nerve block wears off, you will begin to experience pain or discomfort. It is very important that you begin taking your prescribed pain medication before the nerve block fully wears off. Treating your pain at the first sign of the block wearing off will ensure your pain is better controlled and more tolerable when full-sensation returns. Do not wait until the pain is intolerable, as the medicine will be less effective. It is better to treat pain in advance than to try and catch up.   General Anesthesia:  If you did not receive a nerve block during your surgery, you will need to start taking your pain medication shortly after your surgery and  should continue to do so as prescribed by your surgeon.     ICE AND ELEVATION: - You may use ice for the first 48-72 hours, but it is not critical.   - Motion of your fingers is very important to decrease the swelling.  - Elevation, as much as possible for the next 48 hours, is critical for decreasing swelling as well as for pain relief. Elevation means when you are seated or lying down, you hand should be at or above your heart. When walking, the hand needs to be at or above the level of your elbow.  - If the bandage gets too tight, it may need to be loosened. Please contact our office and we will instruct you in how to do this.    SURGICAL BANDAGES:  - Keep your dressing and/or splint clean and dry at all times.  You can remove your dressing 7 days from now and change with a dry dressing or Band-Aids as needed thereafter. - You may place a plastic bag over your bandage to shower, but be careful, do not get your bandages wet.  - After the bandages have been removed, it is OK to get the stitches wet in a shower or with hand washing. Do Not soak or submerge the wound yet. Please do not use lotions or creams on the stitches.      HAND THERAPY:  - You will be contacted to set up  your first therapy appointment.    ACTIVITY AND WORK: - You are encouraged to move any fingers which are not in the bandage.  - Light use of the fingers is allowed to assist the other hand with daily hygiene and eating, but strong gripping or lifting is often uncomfortable and should be avoided.  - You might miss a variable period of time from work and hopefully this issue has been discussed prior to surgery. You may not do any heavy work with your affected hand for about 2 weeks.    [facility] Second Floor, [address] [place], [place] [address] [phone]    Post Anesthesia Home Care Instructions  Activity: Get plenty of rest for the remainder of the day. A responsible individual must stay  with you for 24 hours following the procedure.  For the next 24 hours, DO NOT: -Drive a car -Advertising copywriter -Drink alcoholic beverages -Take any medication unless instructed by your physician -Make any legal decisions or sign important papers.  Meals: Start with liquid foods such as gelatin or soup. Progress to regular foods as tolerated. Avoid greasy, spicy, heavy foods. If nausea and/or vomiting occur, drink only clear liquids until the nausea and/or vomiting subsides. Call your physician if vomiting continues.  Special Instructions/Symptoms: Your throat may feel dry or sore from the anesthesia or the breathing tube placed in your throat during surgery. If this causes discomfort, gargle with warm salt water. The discomfort should disappear within 24 hours.  Tylenol  can be taken after 1:15 pm if needed

## 2024-05-17 NOTE — Anesthesia Postprocedure Evaluation (Signed)
 Anesthesia Post Note  Patient: Justin Mcbride  Procedure(s) Performed: Right ring finger A1 pulley release (Right: Ring Finger)     Patient location during evaluation: PACU Anesthesia Type: MAC Level of consciousness: awake and alert Pain management: pain level controlled Vital Signs Assessment: post-procedure vital signs reviewed and stable Respiratory status: spontaneous breathing, nonlabored ventilation and respiratory function stable Cardiovascular status: stable and blood pressure returned to baseline Anesthetic complications: no   No notable events documented.  Last Vitals:  Vitals:   05/17/24 0915 05/17/24 0929  BP: (!) 153/90 (!) 114/90  Pulse: (!) 54 (!) 55  Resp: 14 20  Temp:  36.6 C  SpO2: 94% 96%    Last Pain:  Vitals:   05/17/24 0929  TempSrc: Temporal  PainSc: 0-No pain                 Debby FORBES Like

## 2024-05-17 NOTE — H&P (Signed)
  HAND SURGERY   HPI: Patient is a 68 y.o. male who presents with right ring trigger finger.  Patient denies any changes to their medical history or new systemic symptoms today.    Past Medical History:  Diagnosis Date   Allergy    Chest pain    GERD (gastroesophageal reflux disease)    Neuropathy    SOB (shortness of breath)    Varicose veins    Past Surgical History:  Procedure Laterality Date   CATARACT EXTRACTION, BILATERAL     HERNIA REPAIR     HERNIA REPAIR     TONSILLECTOMY     VARICOSE VEIN SURGERY     Social History   Socioeconomic History   Marital status: Divorced    Spouse name: Not on file   Number of children: Not on file   Years of education: Not on file   Highest education level: Not on file  Occupational History   Not on file  Tobacco Use   Smoking status: Never   Smokeless tobacco: Never  Substance and Sexual Activity   Alcohol use: No   Drug use: Yes    Types: Cocaine, Crack cocaine    Comment: 2007   Sexual activity: Not on file  Other Topics Concern   Not on file  Social History Narrative   Not on file   Social Drivers of Health   Financial Resource Strain: Not on file  Food Insecurity: Not on file  Transportation Needs: Not on file  Physical Activity: Not on file  Stress: Not on file  Social Connections: Not on file   Family History  Problem Relation Age of Onset   Cancer Father        lung   - negative except otherwise stated in the family history section No Known Allergies Prior to Admission medications   Medication Sig Start Date End Date Taking? Authorizing Provider  esomeprazole  (NEXIUM ) 40 MG capsule Take 1 capsule (40 mg total) by mouth daily. 06/12/15  Yes Armenta Canning, MD  gabapentin (NEURONTIN) 300 MG capsule Take 300 mg by mouth 2 (two) times daily. 03/11/18  Yes [provider]  traMADol  (ULTRAM ) 50 MG tablet Take 1 tablet (50 mg total) by mouth every 12 (twelve) hours as needed. 04/19/18  Yes Gershon Donnice SAUNDERS, DPM   No results found. - Positive ROS: All other systems have been reviewed and were otherwise negative with the exception of those mentioned in the HPI and as above.  Physical Exam: General: No acute distress, resting comfortably Cardiovascular: BUE warm and well perfused, normal rate Respiratory: Normal WOB on RA Skin: Warm and dry Neurologic: Sensation intact distally Psychiatric: Patient is at baseline mood and affect  Right Upper Extremity  TTP over ring finger A1 pulley.  Finger is locked in a flexed position.  Full AROM of remaining fingers.  SILT m/u/r distribution.  Fingers pink and well perfused.    Assessment: 68 yo M w/ locked right ring trigger finger  Plan: OR today for right ring finger A1 pulley release. We again reviewed the risks of surgery which include bleeding, infection, damage to neurovascular structures, persistent symptoms, delayed wound healing, need for additional surgery.  Informed consent was signed.  All questions were answered.   Bebe Galla, M.D. [facility] 8:24 AM

## 2024-05-17 NOTE — Anesthesia Procedure Notes (Signed)
 Procedure Name: MAC Date/Time: 05/17/2024 8:37 AM  Performed by: Denton Niels CROME, CRNAPre-anesthesia Checklist: Patient identified, Emergency Drugs available, Suction available, Patient being monitored and Timeout performed Patient Re-evaluated:Patient Re-evaluated prior to induction Oxygen Delivery Method: Simple face mask Induction Type: IV induction Dental Injury: Teeth and Oropharynx as per pre-operative assessment

## 2024-05-17 NOTE — Interval H&P Note (Signed)
 History and Physical Interval Note:  05/17/2024 8:25 AM  Justin Mcbride  has presented today for surgery, with the diagnosis of Right ring trigger finger.  The various methods of treatment have been discussed with the patient and family. After consideration of risks, benefits and other options for treatment, the patient has consented to  Procedure(s) with comments: RELEASE, A1 PULLEY, FOR TRIGGER FINGER (Right) - Right ring finger A1 Pulley release as a surgical intervention.  The patient's history has been reviewed, patient examined, no change in status, stable for surgery.  I have reviewed the patient's chart and labs.  Questions were answered to the patient's satisfaction.     Justin Mcbride

## 2024-05-17 NOTE — Transfer of Care (Signed)
 Immediate Anesthesia Transfer of Care Note  Patient: Justin Mcbride  Procedure(s) Performed: Right ring finger A1 pulley release (Right: Ring Finger)  Patient Location: PACU  Anesthesia Type:MAC  Level of Consciousness: awake, alert , oriented, and patient cooperative  Airway & Oxygen Therapy: Patient Spontanous Breathing  Post-op Assessment: Report given to RN and Post -op Vital signs reviewed and stable  Post vital signs: Reviewed and stable  Last Vitals:  Vitals Value Taken Time  BP 145/83 05/17/24 09:07  Temp 36.5 C 05/17/24 09:07  Pulse 62 05/17/24 09:08  Resp 14 05/17/24 09:08  SpO2 92 % 05/17/24 09:08  Vitals shown include unfiled device data.  Last Pain:  Vitals:   05/17/24 0713  TempSrc: Oral  PainSc: 0-No pain      Patients Stated Pain Goal: 1 (05/17/24 0713)  Complications: No notable events documented.

## 2024-05-17 NOTE — Op Note (Signed)
   Date of Surgery: 05/17/2024  INDICATIONS: Patient is a 68 y.o.-year-old male with a locked, right ring trigger finger.  Risks, benefits, and alternatives to surgery were again discussed with the patient in the preoperative area. The patient wishes to proceed with surgery.  Informed consent was signed after our discussion.   PREOPERATIVE DIAGNOSIS:  Right ring trigger finger  POSTOPERATIVE DIAGNOSIS: Same.  PROCEDURE:  Right ring finger A1 pulley release   SURGEON: Carlin Galla, M.D.  ASSIST: None  ANESTHESIA:  Local, MAC  IV FLUIDS AND URINE: See anesthesia.  ESTIMATED BLOOD LOSS: <5 mL.  IMPLANTS: * No implants in log *   DRAINS: None  COMPLICATIONS: None noted  DESCRIPTION OF PROCEDURE: The patient was met in the preoperative holding area where the surgical site was marked and the informed consent form was signed.  The patient was then brought back to the operating room and remained on the stretcher.  A hand table was placed adjacent to the operative extremity and locked into place.  A tourniquet was placed on the right forearm.  A formal timeout was performed to confirm that this was the correct patient, surgical side, surgical site, and surgical procedure.  All were present and in agreement. Following formal timeout, a local block was performed using 10 mL of 0.25% plain marcaine.  The right upper extremity was then prepped and draped in the usual and sterile fashion.   The limb was exsanguinated and the tourniquet inflated to 250 mmHg.  A longitudinal incision was made over the A1 pulley.  The skin was incised.  Blunt dissection was used to identify the A1 pulley.  Two Ragnell retractors were placed on the radial and ulnar sides of the pulley to protect the respective neurovascular bundles.  A third Ragnell was placed at the distal aspect of the wound.  The A1 pulley was clearly identified.  Under direct visualization, the pulley was entered sharply using a 15 blade.   Tenotomy scissors were used to complete the pulley release distally to the level of the A2 pulley.  The distal retractor was then placed in the proximal aspect of the wound.  Under direct visualization, the proximal aspect of the A1 pulley was completely released.   The tourniquet was let down and hemostasis achieved with direct pressure and bipolar electrocautery.  The wound was then thoroughly irrigated.  It was closed using 4-0 vicryl rapide sutures in a horizontal mattress fashion.  The wound was dressed with xeroform, 4x4, and an ace wrap.  The patient was reversed from sedation.   All counts were correct x 2 at the end of the procedure.  The patient was then taken to the PACU in stable condition.   POSTOPERATIVE PLAN: He will be discharged to home with appropriate pain medication and discharge instructions.  He does have an associated PIP flexion contracture from his long standing locked trigger finger so a referral will be placed to hand therapy to work on his PIP ROM.  I'll see him in 10-14 days for his first postop visit.   Carlin Galla, MD 9:03 AM

## 2024-05-18 ENCOUNTER — Encounter (HOSPITAL_BASED_OUTPATIENT_CLINIC_OR_DEPARTMENT_OTHER): Payer: Self-pay | Admitting: Orthopedic Surgery
